# Patient Record
Sex: Female | Born: 1935
Health system: Southern US, Community
[De-identification: ages and names within clinical notes are randomized; demographics above are authoritative.]

## PROBLEM LIST (undated history)

## (undated) DIAGNOSIS — G459 Transient cerebral ischemic attack, unspecified: Secondary | ICD-10-CM

## (undated) DIAGNOSIS — I4891 Unspecified atrial fibrillation: Secondary | ICD-10-CM

## (undated) HISTORY — PX: WRIST FRACTURE SURGERY: SHX121

## (undated) HISTORY — PX: FEMUR FRACTURE SURGERY: SHX633

---

## 2011-09-12 DIAGNOSIS — H2589 Other age-related cataract: Secondary | ICD-10-CM | POA: Diagnosis not present

## 2011-09-19 DIAGNOSIS — H269 Unspecified cataract: Secondary | ICD-10-CM | POA: Diagnosis not present

## 2011-09-19 DIAGNOSIS — H2589 Other age-related cataract: Secondary | ICD-10-CM | POA: Diagnosis not present

## 2011-10-10 DIAGNOSIS — H269 Unspecified cataract: Secondary | ICD-10-CM | POA: Diagnosis not present

## 2011-10-10 DIAGNOSIS — H2589 Other age-related cataract: Secondary | ICD-10-CM | POA: Diagnosis not present

## 2013-03-13 DIAGNOSIS — G459 Transient cerebral ischemic attack, unspecified: Secondary | ICD-10-CM

## 2013-03-13 HISTORY — DX: Transient cerebral ischemic attack, unspecified: G45.9

## 2013-10-30 DIAGNOSIS — F17200 Nicotine dependence, unspecified, uncomplicated: Secondary | ICD-10-CM | POA: Diagnosis not present

## 2013-10-30 DIAGNOSIS — I509 Heart failure, unspecified: Secondary | ICD-10-CM | POA: Diagnosis not present

## 2013-10-30 DIAGNOSIS — F411 Generalized anxiety disorder: Secondary | ICD-10-CM | POA: Diagnosis not present

## 2013-10-30 DIAGNOSIS — J9600 Acute respiratory failure, unspecified whether with hypoxia or hypercapnia: Secondary | ICD-10-CM | POA: Diagnosis not present

## 2013-10-30 DIAGNOSIS — R4182 Altered mental status, unspecified: Secondary | ICD-10-CM | POA: Diagnosis not present

## 2013-10-30 DIAGNOSIS — G459 Transient cerebral ischemic attack, unspecified: Secondary | ICD-10-CM | POA: Diagnosis not present

## 2013-10-30 DIAGNOSIS — I658 Occlusion and stenosis of other precerebral arteries: Secondary | ICD-10-CM | POA: Diagnosis not present

## 2013-10-30 DIAGNOSIS — I359 Nonrheumatic aortic valve disorder, unspecified: Secondary | ICD-10-CM | POA: Diagnosis not present

## 2013-10-30 DIAGNOSIS — I059 Rheumatic mitral valve disease, unspecified: Secondary | ICD-10-CM | POA: Diagnosis not present

## 2013-10-30 DIAGNOSIS — M79609 Pain in unspecified limb: Secondary | ICD-10-CM | POA: Diagnosis not present

## 2013-10-30 DIAGNOSIS — S0990XA Unspecified injury of head, initial encounter: Secondary | ICD-10-CM | POA: Diagnosis not present

## 2013-10-30 DIAGNOSIS — I4891 Unspecified atrial fibrillation: Secondary | ICD-10-CM | POA: Diagnosis not present

## 2013-10-30 DIAGNOSIS — R5383 Other fatigue: Secondary | ICD-10-CM | POA: Diagnosis not present

## 2013-10-30 DIAGNOSIS — R0902 Hypoxemia: Secondary | ICD-10-CM | POA: Diagnosis not present

## 2013-10-30 DIAGNOSIS — R29810 Facial weakness: Secondary | ICD-10-CM | POA: Diagnosis present

## 2013-10-30 DIAGNOSIS — R404 Transient alteration of awareness: Secondary | ICD-10-CM | POA: Diagnosis not present

## 2013-10-30 DIAGNOSIS — I6350 Cerebral infarction due to unspecified occlusion or stenosis of unspecified cerebral artery: Secondary | ICD-10-CM | POA: Diagnosis not present

## 2013-10-30 DIAGNOSIS — I517 Cardiomegaly: Secondary | ICD-10-CM | POA: Diagnosis not present

## 2013-10-30 DIAGNOSIS — R4789 Other speech disturbances: Secondary | ICD-10-CM | POA: Diagnosis present

## 2013-10-30 DIAGNOSIS — I369 Nonrheumatic tricuspid valve disorder, unspecified: Secondary | ICD-10-CM | POA: Diagnosis not present

## 2013-10-30 DIAGNOSIS — R5381 Other malaise: Secondary | ICD-10-CM | POA: Diagnosis present

## 2013-11-06 DIAGNOSIS — IMO0002 Reserved for concepts with insufficient information to code with codable children: Secondary | ICD-10-CM | POA: Diagnosis not present

## 2013-11-06 DIAGNOSIS — Z1331 Encounter for screening for depression: Secondary | ICD-10-CM | POA: Diagnosis not present

## 2013-11-06 DIAGNOSIS — I6789 Other cerebrovascular disease: Secondary | ICD-10-CM | POA: Diagnosis not present

## 2013-11-06 DIAGNOSIS — I509 Heart failure, unspecified: Secondary | ICD-10-CM | POA: Diagnosis not present

## 2013-11-27 DIAGNOSIS — Z79899 Other long term (current) drug therapy: Secondary | ICD-10-CM | POA: Diagnosis not present

## 2013-11-27 DIAGNOSIS — I4891 Unspecified atrial fibrillation: Secondary | ICD-10-CM | POA: Diagnosis not present

## 2013-11-27 DIAGNOSIS — I509 Heart failure, unspecified: Secondary | ICD-10-CM | POA: Diagnosis not present

## 2013-11-27 DIAGNOSIS — Z6827 Body mass index (BMI) 27.0-27.9, adult: Secondary | ICD-10-CM | POA: Diagnosis not present

## 2014-01-01 DIAGNOSIS — Z6827 Body mass index (BMI) 27.0-27.9, adult: Secondary | ICD-10-CM | POA: Diagnosis not present

## 2014-01-01 DIAGNOSIS — Z1389 Encounter for screening for other disorder: Secondary | ICD-10-CM | POA: Diagnosis not present

## 2014-01-01 DIAGNOSIS — I4891 Unspecified atrial fibrillation: Secondary | ICD-10-CM | POA: Diagnosis not present

## 2014-01-01 DIAGNOSIS — I639 Cerebral infarction, unspecified: Secondary | ICD-10-CM | POA: Diagnosis not present

## 2014-01-01 DIAGNOSIS — I10 Essential (primary) hypertension: Secondary | ICD-10-CM | POA: Diagnosis not present

## 2014-01-01 DIAGNOSIS — Z9181 History of falling: Secondary | ICD-10-CM | POA: Diagnosis not present

## 2014-01-01 DIAGNOSIS — I509 Heart failure, unspecified: Secondary | ICD-10-CM | POA: Diagnosis not present

## 2014-04-09 DIAGNOSIS — I10 Essential (primary) hypertension: Secondary | ICD-10-CM | POA: Diagnosis not present

## 2014-04-09 DIAGNOSIS — K219 Gastro-esophageal reflux disease without esophagitis: Secondary | ICD-10-CM | POA: Diagnosis not present

## 2014-04-09 DIAGNOSIS — I509 Heart failure, unspecified: Secondary | ICD-10-CM | POA: Diagnosis not present

## 2014-04-09 DIAGNOSIS — I4891 Unspecified atrial fibrillation: Secondary | ICD-10-CM | POA: Diagnosis not present

## 2014-04-09 DIAGNOSIS — Z6827 Body mass index (BMI) 27.0-27.9, adult: Secondary | ICD-10-CM | POA: Diagnosis not present

## 2014-08-13 DIAGNOSIS — Z1389 Encounter for screening for other disorder: Secondary | ICD-10-CM | POA: Diagnosis not present

## 2014-08-13 DIAGNOSIS — I509 Heart failure, unspecified: Secondary | ICD-10-CM | POA: Diagnosis not present

## 2014-08-13 DIAGNOSIS — Z6827 Body mass index (BMI) 27.0-27.9, adult: Secondary | ICD-10-CM | POA: Diagnosis not present

## 2014-08-13 DIAGNOSIS — I639 Cerebral infarction, unspecified: Secondary | ICD-10-CM | POA: Diagnosis not present

## 2014-08-13 DIAGNOSIS — I4891 Unspecified atrial fibrillation: Secondary | ICD-10-CM | POA: Diagnosis not present

## 2014-08-13 DIAGNOSIS — K219 Gastro-esophageal reflux disease without esophagitis: Secondary | ICD-10-CM | POA: Diagnosis not present

## 2014-08-13 DIAGNOSIS — I10 Essential (primary) hypertension: Secondary | ICD-10-CM | POA: Diagnosis not present

## 2014-12-17 DIAGNOSIS — I482 Chronic atrial fibrillation: Secondary | ICD-10-CM | POA: Diagnosis not present

## 2014-12-17 DIAGNOSIS — I679 Cerebrovascular disease, unspecified: Secondary | ICD-10-CM | POA: Diagnosis not present

## 2014-12-17 DIAGNOSIS — M79602 Pain in left arm: Secondary | ICD-10-CM | POA: Diagnosis not present

## 2014-12-17 DIAGNOSIS — I10 Essential (primary) hypertension: Secondary | ICD-10-CM | POA: Diagnosis not present

## 2014-12-17 DIAGNOSIS — Z9181 History of falling: Secondary | ICD-10-CM | POA: Diagnosis not present

## 2014-12-17 DIAGNOSIS — Z6827 Body mass index (BMI) 27.0-27.9, adult: Secondary | ICD-10-CM | POA: Diagnosis not present

## 2014-12-17 DIAGNOSIS — K219 Gastro-esophageal reflux disease without esophagitis: Secondary | ICD-10-CM | POA: Diagnosis not present

## 2015-06-03 DIAGNOSIS — I482 Chronic atrial fibrillation: Secondary | ICD-10-CM | POA: Diagnosis not present

## 2015-06-03 DIAGNOSIS — K219 Gastro-esophageal reflux disease without esophagitis: Secondary | ICD-10-CM | POA: Diagnosis not present

## 2015-06-03 DIAGNOSIS — I509 Heart failure, unspecified: Secondary | ICD-10-CM | POA: Diagnosis not present

## 2015-06-03 DIAGNOSIS — Z6827 Body mass index (BMI) 27.0-27.9, adult: Secondary | ICD-10-CM | POA: Diagnosis not present

## 2015-06-03 DIAGNOSIS — I10 Essential (primary) hypertension: Secondary | ICD-10-CM | POA: Diagnosis not present

## 2015-06-03 DIAGNOSIS — Z720 Tobacco use: Secondary | ICD-10-CM | POA: Diagnosis not present

## 2015-10-07 DIAGNOSIS — Z1389 Encounter for screening for other disorder: Secondary | ICD-10-CM | POA: Diagnosis not present

## 2015-10-07 DIAGNOSIS — E559 Vitamin D deficiency, unspecified: Secondary | ICD-10-CM | POA: Diagnosis not present

## 2015-10-07 DIAGNOSIS — I482 Chronic atrial fibrillation: Secondary | ICD-10-CM | POA: Diagnosis not present

## 2015-10-07 DIAGNOSIS — Z9181 History of falling: Secondary | ICD-10-CM | POA: Diagnosis not present

## 2015-10-07 DIAGNOSIS — I509 Heart failure, unspecified: Secondary | ICD-10-CM | POA: Diagnosis not present

## 2015-10-07 DIAGNOSIS — Z720 Tobacco use: Secondary | ICD-10-CM | POA: Diagnosis not present

## 2015-10-07 DIAGNOSIS — I10 Essential (primary) hypertension: Secondary | ICD-10-CM | POA: Diagnosis not present

## 2016-01-01 DIAGNOSIS — S8012XA Contusion of left lower leg, initial encounter: Secondary | ICD-10-CM | POA: Diagnosis not present

## 2016-01-01 DIAGNOSIS — S70922A Unspecified superficial injury of left thigh, initial encounter: Secondary | ICD-10-CM | POA: Diagnosis not present

## 2016-01-01 DIAGNOSIS — M25532 Pain in left wrist: Secondary | ICD-10-CM | POA: Diagnosis not present

## 2016-01-01 DIAGNOSIS — R9082 White matter disease, unspecified: Secondary | ICD-10-CM | POA: Diagnosis not present

## 2016-01-01 DIAGNOSIS — S199XXA Unspecified injury of neck, initial encounter: Secondary | ICD-10-CM | POA: Diagnosis not present

## 2016-01-01 DIAGNOSIS — S52592A Other fractures of lower end of left radius, initial encounter for closed fracture: Secondary | ICD-10-CM | POA: Diagnosis not present

## 2016-01-01 DIAGNOSIS — M25552 Pain in left hip: Secondary | ICD-10-CM | POA: Diagnosis not present

## 2016-01-01 DIAGNOSIS — S79912A Unspecified injury of left hip, initial encounter: Secondary | ICD-10-CM | POA: Diagnosis not present

## 2016-01-01 DIAGNOSIS — S52532A Colles' fracture of left radius, initial encounter for closed fracture: Secondary | ICD-10-CM | POA: Diagnosis not present

## 2016-01-01 DIAGNOSIS — S0990XA Unspecified injury of head, initial encounter: Secondary | ICD-10-CM | POA: Diagnosis not present

## 2016-01-01 DIAGNOSIS — S8992XA Unspecified injury of left lower leg, initial encounter: Secondary | ICD-10-CM | POA: Diagnosis not present

## 2016-01-01 DIAGNOSIS — M25562 Pain in left knee: Secondary | ICD-10-CM | POA: Diagnosis not present

## 2016-01-01 DIAGNOSIS — T148XXA Other injury of unspecified body region, initial encounter: Secondary | ICD-10-CM | POA: Diagnosis not present

## 2016-01-01 DIAGNOSIS — M79652 Pain in left thigh: Secondary | ICD-10-CM | POA: Diagnosis not present

## 2016-01-01 DIAGNOSIS — I700 Atherosclerosis of aorta: Secondary | ICD-10-CM | POA: Diagnosis not present

## 2016-01-01 DIAGNOSIS — M79662 Pain in left lower leg: Secondary | ICD-10-CM | POA: Diagnosis not present

## 2016-01-04 DIAGNOSIS — S52602A Unspecified fracture of lower end of left ulna, initial encounter for closed fracture: Secondary | ICD-10-CM | POA: Diagnosis not present

## 2016-01-04 DIAGNOSIS — S52502A Unspecified fracture of the lower end of left radius, initial encounter for closed fracture: Secondary | ICD-10-CM | POA: Diagnosis not present

## 2016-02-01 DIAGNOSIS — S52502A Unspecified fracture of the lower end of left radius, initial encounter for closed fracture: Secondary | ICD-10-CM | POA: Diagnosis not present

## 2016-02-10 DIAGNOSIS — I10 Essential (primary) hypertension: Secondary | ICD-10-CM | POA: Diagnosis not present

## 2016-02-10 DIAGNOSIS — S62102D Fracture of unspecified carpal bone, left wrist, subsequent encounter for fracture with routine healing: Secondary | ICD-10-CM | POA: Diagnosis not present

## 2016-02-10 DIAGNOSIS — Z2821 Immunization not carried out because of patient refusal: Secondary | ICD-10-CM | POA: Diagnosis not present

## 2016-02-10 DIAGNOSIS — I509 Heart failure, unspecified: Secondary | ICD-10-CM | POA: Diagnosis not present

## 2016-02-10 DIAGNOSIS — Z720 Tobacco use: Secondary | ICD-10-CM | POA: Diagnosis not present

## 2016-02-10 DIAGNOSIS — I482 Chronic atrial fibrillation: Secondary | ICD-10-CM | POA: Diagnosis not present

## 2016-02-17 DIAGNOSIS — S52502A Unspecified fracture of the lower end of left radius, initial encounter for closed fracture: Secondary | ICD-10-CM | POA: Diagnosis not present

## 2016-03-24 DIAGNOSIS — S52502A Unspecified fracture of the lower end of left radius, initial encounter for closed fracture: Secondary | ICD-10-CM | POA: Diagnosis not present

## 2016-06-08 DIAGNOSIS — K219 Gastro-esophageal reflux disease without esophagitis: Secondary | ICD-10-CM | POA: Diagnosis not present

## 2016-06-08 DIAGNOSIS — I509 Heart failure, unspecified: Secondary | ICD-10-CM | POA: Diagnosis not present

## 2016-06-08 DIAGNOSIS — I472 Ventricular tachycardia: Secondary | ICD-10-CM | POA: Diagnosis not present

## 2016-06-08 DIAGNOSIS — I10 Essential (primary) hypertension: Secondary | ICD-10-CM | POA: Diagnosis not present

## 2016-06-08 DIAGNOSIS — Z6827 Body mass index (BMI) 27.0-27.9, adult: Secondary | ICD-10-CM | POA: Diagnosis not present

## 2016-10-12 DIAGNOSIS — I509 Heart failure, unspecified: Secondary | ICD-10-CM | POA: Diagnosis not present

## 2016-10-12 DIAGNOSIS — Z6827 Body mass index (BMI) 27.0-27.9, adult: Secondary | ICD-10-CM | POA: Diagnosis not present

## 2016-10-12 DIAGNOSIS — Z9181 History of falling: Secondary | ICD-10-CM | POA: Diagnosis not present

## 2016-10-12 DIAGNOSIS — I679 Cerebrovascular disease, unspecified: Secondary | ICD-10-CM | POA: Diagnosis not present

## 2016-10-12 DIAGNOSIS — I482 Chronic atrial fibrillation: Secondary | ICD-10-CM | POA: Diagnosis not present

## 2016-10-12 DIAGNOSIS — R42 Dizziness and giddiness: Secondary | ICD-10-CM | POA: Diagnosis not present

## 2016-10-12 DIAGNOSIS — I10 Essential (primary) hypertension: Secondary | ICD-10-CM | POA: Diagnosis not present

## 2016-10-12 DIAGNOSIS — Z1389 Encounter for screening for other disorder: Secondary | ICD-10-CM | POA: Diagnosis not present

## 2017-01-16 DIAGNOSIS — R29705 NIHSS score 5: Secondary | ICD-10-CM | POA: Diagnosis not present

## 2017-01-16 DIAGNOSIS — R531 Weakness: Secondary | ICD-10-CM | POA: Diagnosis not present

## 2017-01-16 DIAGNOSIS — I4891 Unspecified atrial fibrillation: Secondary | ICD-10-CM | POA: Diagnosis not present

## 2017-01-16 DIAGNOSIS — I639 Cerebral infarction, unspecified: Secondary | ICD-10-CM | POA: Diagnosis not present

## 2017-01-16 DIAGNOSIS — K219 Gastro-esophageal reflux disease without esophagitis: Secondary | ICD-10-CM | POA: Diagnosis not present

## 2017-01-16 DIAGNOSIS — I6789 Other cerebrovascular disease: Secondary | ICD-10-CM | POA: Diagnosis not present

## 2017-01-16 DIAGNOSIS — F17210 Nicotine dependence, cigarettes, uncomplicated: Secondary | ICD-10-CM | POA: Diagnosis not present

## 2017-01-17 ENCOUNTER — Emergency Department (HOSPITAL_COMMUNITY): Payer: Medicare Other

## 2017-01-17 ENCOUNTER — Inpatient Hospital Stay (HOSPITAL_COMMUNITY): Payer: Medicare Other

## 2017-01-17 ENCOUNTER — Encounter (HOSPITAL_COMMUNITY): Payer: Self-pay | Admitting: Emergency Medicine

## 2017-01-17 ENCOUNTER — Inpatient Hospital Stay (HOSPITAL_COMMUNITY)
Admission: EM | Admit: 2017-01-17 | Discharge: 2017-01-20 | DRG: 064 | Disposition: A | Discharge status: Home Health | Payer: Medicare Other | Attending: Internal Medicine | Admitting: Internal Medicine

## 2017-01-17 DIAGNOSIS — I63512 Cerebral infarction due to unspecified occlusion or stenosis of left middle cerebral artery: Secondary | ICD-10-CM

## 2017-01-17 DIAGNOSIS — K219 Gastro-esophageal reflux disease without esophagitis: Secondary | ICD-10-CM | POA: Diagnosis present

## 2017-01-17 DIAGNOSIS — Z66 Do not resuscitate: Secondary | ICD-10-CM | POA: Diagnosis present

## 2017-01-17 DIAGNOSIS — G8191 Hemiplegia, unspecified affecting right dominant side: Secondary | ICD-10-CM | POA: Diagnosis present

## 2017-01-17 DIAGNOSIS — R4702 Dysphasia: Secondary | ICD-10-CM | POA: Diagnosis present

## 2017-01-17 DIAGNOSIS — R29707 NIHSS score 7: Secondary | ICD-10-CM | POA: Diagnosis present

## 2017-01-17 DIAGNOSIS — Z9282 Status post administration of tPA (rtPA) in a different facility within the last 24 hours prior to admission to current facility: Secondary | ICD-10-CM | POA: Diagnosis not present

## 2017-01-17 DIAGNOSIS — I609 Nontraumatic subarachnoid hemorrhage, unspecified: Secondary | ICD-10-CM | POA: Diagnosis not present

## 2017-01-17 DIAGNOSIS — I63412 Cerebral infarction due to embolism of left middle cerebral artery: Principal | ICD-10-CM | POA: Diagnosis present

## 2017-01-17 DIAGNOSIS — I482 Chronic atrial fibrillation, unspecified: Secondary | ICD-10-CM

## 2017-01-17 DIAGNOSIS — Z8673 Personal history of transient ischemic attack (TIA), and cerebral infarction without residual deficits: Secondary | ICD-10-CM | POA: Diagnosis not present

## 2017-01-17 DIAGNOSIS — R29705 NIHSS score 5: Secondary | ICD-10-CM | POA: Diagnosis not present

## 2017-01-17 DIAGNOSIS — S065X9A Traumatic subdural hemorrhage with loss of consciousness of unspecified duration, initial encounter: Secondary | ICD-10-CM | POA: Diagnosis not present

## 2017-01-17 DIAGNOSIS — T45615A Adverse effect of thrombolytic drugs, initial encounter: Secondary | ICD-10-CM | POA: Diagnosis present

## 2017-01-17 DIAGNOSIS — R471 Dysarthria and anarthria: Secondary | ICD-10-CM | POA: Diagnosis present

## 2017-01-17 DIAGNOSIS — R1310 Dysphagia, unspecified: Secondary | ICD-10-CM | POA: Diagnosis present

## 2017-01-17 DIAGNOSIS — K921 Melena: Secondary | ICD-10-CM | POA: Diagnosis present

## 2017-01-17 DIAGNOSIS — R739 Hyperglycemia, unspecified: Secondary | ICD-10-CM | POA: Diagnosis present

## 2017-01-17 DIAGNOSIS — J9601 Acute respiratory failure with hypoxia: Secondary | ICD-10-CM | POA: Diagnosis present

## 2017-01-17 DIAGNOSIS — E873 Alkalosis: Secondary | ICD-10-CM | POA: Diagnosis present

## 2017-01-17 DIAGNOSIS — J810 Acute pulmonary edema: Secondary | ICD-10-CM

## 2017-01-17 DIAGNOSIS — K625 Hemorrhage of anus and rectum: Secondary | ICD-10-CM | POA: Diagnosis not present

## 2017-01-17 DIAGNOSIS — S065XAA Traumatic subdural hemorrhage with loss of consciousness status unknown, initial encounter: Secondary | ICD-10-CM

## 2017-01-17 DIAGNOSIS — I5022 Chronic systolic (congestive) heart failure: Secondary | ICD-10-CM | POA: Diagnosis present

## 2017-01-17 DIAGNOSIS — D649 Anemia, unspecified: Secondary | ICD-10-CM | POA: Diagnosis not present

## 2017-01-17 DIAGNOSIS — I429 Cardiomyopathy, unspecified: Secondary | ICD-10-CM | POA: Diagnosis present

## 2017-01-17 DIAGNOSIS — Z9114 Patient's other noncompliance with medication regimen: Secondary | ICD-10-CM

## 2017-01-17 DIAGNOSIS — I6200 Nontraumatic subdural hemorrhage, unspecified: Secondary | ICD-10-CM | POA: Diagnosis present

## 2017-01-17 DIAGNOSIS — I4891 Unspecified atrial fibrillation: Secondary | ICD-10-CM | POA: Diagnosis not present

## 2017-01-17 DIAGNOSIS — I517 Cardiomegaly: Secondary | ICD-10-CM | POA: Diagnosis not present

## 2017-01-17 DIAGNOSIS — I6789 Other cerebrovascular disease: Secondary | ICD-10-CM | POA: Diagnosis not present

## 2017-01-17 DIAGNOSIS — I255 Ischemic cardiomyopathy: Secondary | ICD-10-CM

## 2017-01-17 DIAGNOSIS — I361 Nonrheumatic tricuspid (valve) insufficiency: Secondary | ICD-10-CM

## 2017-01-17 DIAGNOSIS — I110 Hypertensive heart disease with heart failure: Secondary | ICD-10-CM | POA: Diagnosis present

## 2017-01-17 DIAGNOSIS — R4182 Altered mental status, unspecified: Secondary | ICD-10-CM | POA: Diagnosis not present

## 2017-01-17 DIAGNOSIS — F17210 Nicotine dependence, cigarettes, uncomplicated: Secondary | ICD-10-CM | POA: Diagnosis present

## 2017-01-17 DIAGNOSIS — F0390 Unspecified dementia without behavioral disturbance: Secondary | ICD-10-CM | POA: Diagnosis present

## 2017-01-17 DIAGNOSIS — G9341 Metabolic encephalopathy: Secondary | ICD-10-CM | POA: Diagnosis present

## 2017-01-17 DIAGNOSIS — I10 Essential (primary) hypertension: Secondary | ICD-10-CM | POA: Diagnosis not present

## 2017-01-17 DIAGNOSIS — J9621 Acute and chronic respiratory failure with hypoxia: Secondary | ICD-10-CM | POA: Diagnosis not present

## 2017-01-17 DIAGNOSIS — R0602 Shortness of breath: Secondary | ICD-10-CM | POA: Diagnosis not present

## 2017-01-17 DIAGNOSIS — R531 Weakness: Secondary | ICD-10-CM | POA: Diagnosis not present

## 2017-01-17 DIAGNOSIS — I509 Heart failure, unspecified: Secondary | ICD-10-CM

## 2017-01-17 DIAGNOSIS — Z7982 Long term (current) use of aspirin: Secondary | ICD-10-CM | POA: Diagnosis not present

## 2017-01-17 DIAGNOSIS — S066X0A Traumatic subarachnoid hemorrhage without loss of consciousness, initial encounter: Secondary | ICD-10-CM | POA: Diagnosis not present

## 2017-01-17 DIAGNOSIS — I639 Cerebral infarction, unspecified: Secondary | ICD-10-CM | POA: Diagnosis not present

## 2017-01-17 DIAGNOSIS — D72829 Elevated white blood cell count, unspecified: Secondary | ICD-10-CM | POA: Diagnosis present

## 2017-01-17 DIAGNOSIS — E785 Hyperlipidemia, unspecified: Secondary | ICD-10-CM | POA: Diagnosis present

## 2017-01-17 DIAGNOSIS — R4701 Aphasia: Secondary | ICD-10-CM | POA: Diagnosis present

## 2017-01-17 DIAGNOSIS — Z4682 Encounter for fitting and adjustment of non-vascular catheter: Secondary | ICD-10-CM | POA: Diagnosis not present

## 2017-01-17 DIAGNOSIS — S065X0A Traumatic subdural hemorrhage without loss of consciousness, initial encounter: Secondary | ICD-10-CM | POA: Diagnosis not present

## 2017-01-17 HISTORY — DX: Transient cerebral ischemic attack, unspecified: G45.9

## 2017-01-17 HISTORY — DX: Unspecified atrial fibrillation: I48.91

## 2017-01-17 LAB — CBC WITH DIFFERENTIAL/PLATELET
BASOS PCT: 1 %
Basophils Absolute: 0.1 10*3/uL (ref 0.0–0.1)
EOS ABS: 0.3 10*3/uL (ref 0.0–0.7)
Eosinophils Relative: 4 %
HCT: 39.1 % (ref 36.0–46.0)
Hemoglobin: 12.1 g/dL (ref 12.0–15.0)
Lymphocytes Relative: 17 %
Lymphs Abs: 1.5 10*3/uL (ref 0.7–4.0)
MCH: 28.4 pg (ref 26.0–34.0)
MCHC: 30.9 g/dL (ref 30.0–36.0)
MCV: 91.8 fL (ref 78.0–100.0)
MONOS PCT: 6 %
Monocytes Absolute: 0.5 10*3/uL (ref 0.1–1.0)
Neutro Abs: 6.3 10*3/uL (ref 1.7–7.7)
Neutrophils Relative %: 72 %
Platelets: 221 10*3/uL (ref 150–400)
RBC: 4.26 MIL/uL (ref 3.87–5.11)
RDW: 13.3 % (ref 11.5–15.5)
WBC: 8.6 10*3/uL (ref 4.0–10.5)

## 2017-01-17 LAB — URINALYSIS, ROUTINE W REFLEX MICROSCOPIC
Bilirubin Urine: NEGATIVE
GLUCOSE, UA: NEGATIVE mg/dL
Hgb urine dipstick: NEGATIVE
Ketones, ur: NEGATIVE mg/dL
LEUKOCYTES UA: NEGATIVE
NITRITE: NEGATIVE
PH: 5 (ref 5.0–8.0)
Protein, ur: NEGATIVE mg/dL

## 2017-01-17 LAB — I-STAT CHEM 8, ED
BUN: 15 mg/dL (ref 6–20)
CALCIUM ION: 1.16 mmol/L (ref 1.15–1.40)
CHLORIDE: 106 mmol/L (ref 101–111)
CREATININE: 0.8 mg/dL (ref 0.44–1.00)
GLUCOSE: 182 mg/dL — AB (ref 65–99)
HCT: 36 % (ref 36.0–46.0)
Hemoglobin: 12.2 g/dL (ref 12.0–15.0)
Potassium: 4.1 mmol/L (ref 3.5–5.1)
Sodium: 140 mmol/L (ref 135–145)
TCO2: 25 mmol/L (ref 22–32)

## 2017-01-17 LAB — BRAIN NATRIURETIC PEPTIDE: B NATRIURETIC PEPTIDE 5: 140.1 pg/mL — AB (ref 0.0–100.0)

## 2017-01-17 LAB — COMPREHENSIVE METABOLIC PANEL
ALBUMIN: 3.1 g/dL — AB (ref 3.5–5.0)
ALT: 16 U/L (ref 14–54)
ANION GAP: 9 (ref 5–15)
AST: 30 U/L (ref 15–41)
Alkaline Phosphatase: 91 U/L (ref 38–126)
BILIRUBIN TOTAL: 0.7 mg/dL (ref 0.3–1.2)
BUN: 13 mg/dL (ref 6–20)
CO2: 23 mmol/L (ref 22–32)
Calcium: 8.9 mg/dL (ref 8.9–10.3)
Chloride: 106 mmol/L (ref 101–111)
Creatinine, Ser: 0.88 mg/dL (ref 0.44–1.00)
GFR calc Af Amer: >60 mL/min (ref >60)
GFR calc non Af Amer: 59 mL/min — ABNORMAL LOW (ref >60)
GLUCOSE: 183 mg/dL — AB (ref 65–99)
POTASSIUM: 4.1 mmol/L (ref 3.5–5.1)
Sodium: 138 mmol/L (ref 135–145)
TOTAL PROTEIN: 6.2 g/dL — AB (ref 6.5–8.1)

## 2017-01-17 LAB — I-STAT TROPONIN, ED: Troponin i, poc: 0 ng/mL (ref 0.00–0.08)

## 2017-01-17 LAB — MAGNESIUM: MAGNESIUM: 1.7 mg/dL (ref 1.7–2.4)

## 2017-01-17 LAB — CBG MONITORING, ED: Glucose-Capillary: 179 mg/dL — ABNORMAL HIGH (ref 65–99)

## 2017-01-17 LAB — I-STAT ARTERIAL BLOOD GAS, ED
Acid-Base Excess: 2 mmol/L (ref 0.0–2.0)
BICARBONATE: 28.9 mmol/L — AB (ref 20.0–28.0)
O2 SAT: 100 %
PCO2 ART: 54.3 mmHg — AB (ref 32.0–48.0)
PO2 ART: 444 mmHg — AB (ref 83.0–108.0)
Patient temperature: 97.9
TCO2: 31 mmol/L (ref 22–32)
pH, Arterial: 7.332 — ABNORMAL LOW (ref 7.350–7.450)

## 2017-01-17 LAB — TYPE AND SCREEN
ABO/RH(D): A POS
Antibody Screen: NEGATIVE

## 2017-01-17 LAB — GLUCOSE, CAPILLARY
GLUCOSE-CAPILLARY: 144 mg/dL — AB (ref 65–99)
Glucose-Capillary: 113 mg/dL — ABNORMAL HIGH (ref 65–99)
Glucose-Capillary: 113 mg/dL — ABNORMAL HIGH (ref 65–99)
Glucose-Capillary: 116 mg/dL — ABNORMAL HIGH (ref 65–99)
Glucose-Capillary: 148 mg/dL — ABNORMAL HIGH (ref 65–99)

## 2017-01-17 LAB — ECHOCARDIOGRAM COMPLETE
Height: 63 in
Weight: 2511.48 oz

## 2017-01-17 LAB — PROTIME-INR
INR: 1.08
INR: 1.03
Prothrombin Time: 13.9 seconds (ref 11.4–15.2)
Prothrombin Time: 13.4 seconds (ref 11.4–15.2)

## 2017-01-17 LAB — MRSA PCR SCREENING: MRSA by PCR: NEGATIVE

## 2017-01-17 LAB — ABO/RH: ABO/RH(D): A POS

## 2017-01-17 LAB — FIBRINOGEN: FIBRINOGEN: 357 mg/dL (ref 210–475)

## 2017-01-17 LAB — PHOSPHORUS: PHOSPHORUS: 4.8 mg/dL — AB (ref 2.5–4.6)

## 2017-01-17 LAB — TRIGLYCERIDES: Triglycerides: 115 mg/dL (ref <150)

## 2017-01-17 LAB — APTT: APTT: 27 s (ref 24–36)

## 2017-01-17 MED ORDER — METHYLPREDNISOLONE SODIUM SUCC 125 MG IJ SOLR
125.0000 mg | Freq: Once | INTRAMUSCULAR | Status: AC
  Administered 2017-01-17: 125 mg via INTRAVENOUS
  Filled 2017-01-17: qty 2

## 2017-01-17 MED ORDER — ALBUTEROL (5 MG/ML) CONTINUOUS INHALATION SOLN: 10.0000 mg/h | INHALATION_SOLUTION | RESPIRATORY_TRACT | Status: DC

## 2017-01-17 MED ORDER — TRANEXAMIC ACID 1000 MG/10ML IV SOLN
700.0000 mg | INTRAVENOUS | Status: AC
  Administered 2017-01-17: 700 mg via INTRAVENOUS
  Filled 2017-01-17: qty 7

## 2017-01-17 MED ORDER — MIDAZOLAM HCL 2 MG/2ML IJ SOLN: 1.0000 mg | INTRAMUSCULAR | Status: DC | PRN

## 2017-01-17 MED ORDER — ALBUTEROL SULFATE (2.5 MG/3ML) 0.083% IN NEBU: 2.5000 mg | INHALATION_SOLUTION | RESPIRATORY_TRACT | Status: DC | PRN

## 2017-01-17 MED ORDER — CHLORHEXIDINE GLUCONATE 0.12% ORAL RINSE (MEDLINE KIT)
15.0000 mL | Freq: Two times a day (BID) | OROMUCOSAL | Status: DC
  Administered 2017-01-17 – 2017-01-18 (×3): 15 mL via OROMUCOSAL

## 2017-01-17 MED ORDER — PROPOFOL 1000 MG/100ML IV EMUL
0.0000 ug/kg/min | INTRAVENOUS | Status: DC
  Administered 2017-01-17: 50 ug/kg/min via INTRAVENOUS
  Administered 2017-01-17: 25 ug/kg/min via INTRAVENOUS
  Administered 2017-01-17: 15 ug/kg/min via INTRAVENOUS
  Filled 2017-01-17 (×3): qty 100

## 2017-01-17 MED ORDER — ORAL CARE MOUTH RINSE
15.0000 mL | OROMUCOSAL | Status: DC
  Administered 2017-01-17 – 2017-01-18 (×12): 15 mL via OROMUCOSAL

## 2017-01-17 MED ORDER — PROPOFOL 1000 MG/100ML IV EMUL
INTRAVENOUS | Status: AC
  Administered 2017-01-17: 03:00:00
  Filled 2017-01-17: qty 100

## 2017-01-17 MED ORDER — IPRATROPIUM BROMIDE 0.02 % IN SOLN
1.0000 mg | RESPIRATORY_TRACT | Status: AC
  Filled 2017-01-17: qty 5

## 2017-01-17 MED ORDER — FUROSEMIDE 10 MG/ML IJ SOLN
40.0000 mg | Freq: Once | INTRAMUSCULAR | Status: AC
  Administered 2017-01-17: 40 mg via INTRAVENOUS
  Filled 2017-01-17: qty 4

## 2017-01-17 MED ORDER — ALBUTEROL (5 MG/ML) CONTINUOUS INHALATION SOLN
INHALATION_SOLUTION | RESPIRATORY_TRACT | Status: AC
  Administered 2017-01-17: 10 mg
  Filled 2017-01-17: qty 2

## 2017-01-17 MED ORDER — ETOMIDATE 2 MG/ML IV SOLN
INTRAVENOUS | Status: AC | PRN
  Administered 2017-01-17: 20 mg via INTRAVENOUS

## 2017-01-17 MED ORDER — INSULIN ASPART 100 UNIT/ML ~~LOC~~ SOLN
0.0000 [IU] | Freq: Three times a day (TID) | SUBCUTANEOUS | Status: DC
  Administered 2017-01-17: 1 [IU] via SUBCUTANEOUS

## 2017-01-17 MED ORDER — IPRATROPIUM BROMIDE 0.02 % IN SOLN
RESPIRATORY_TRACT | Status: AC
  Administered 2017-01-17: 1 mg
  Filled 2017-01-17: qty 5

## 2017-01-17 MED ORDER — PANTOPRAZOLE SODIUM 40 MG IV SOLR
40.0000 mg | Freq: Every day | INTRAVENOUS | Status: DC
  Administered 2017-01-17 – 2017-01-18 (×2): 40 mg via INTRAVENOUS
  Filled 2017-01-17 (×2): qty 40

## 2017-01-17 MED ORDER — LABETALOL HCL 5 MG/ML IV SOLN
20.0000 mg | Freq: Once | INTRAVENOUS | Status: DC
  Filled 2017-01-17: qty 4

## 2017-01-17 MED ORDER — SODIUM CHLORIDE 0.9 % IV SOLN
INTRAVENOUS | Status: DC
  Administered 2017-01-17 – 2017-01-19 (×4): via INTRAVENOUS

## 2017-01-17 MED ORDER — LORAZEPAM 2 MG/ML IJ SOLN
INTRAMUSCULAR | Status: AC
  Administered 2017-01-17: 2 mg
  Filled 2017-01-17: qty 1

## 2017-01-17 MED ORDER — SUCCINYLCHOLINE CHLORIDE 20 MG/ML IJ SOLN
INTRAMUSCULAR | Status: AC | PRN
  Administered 2017-01-17: 100 mg via INTRAVENOUS

## 2017-01-17 NOTE — ED Provider Notes (Addendum)
TIME SEEN: 2:45 AM  CHIEF COMPLAINT: Code stroke  HPI: Patient is an 83 year old female with history of CHF, atrial fibrillation not on anticoagulation , hypertension who presents to the emergency department as a transfer from [facility] as a code stroke.  Per [facility] physician patient was last seen normal at 9 PM.  She presented to the ER with expressive aphasia and right-sided weakness.  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  Telemetry neurology recommended TPA which she received at [facility] and finished at 1:56 AM.  Prior to transfer patient was stable per the ER physician Dr. Phylis BougieVillard.  Per EMS, patient was given 20 mg of labetalol prior to discharge for hypotension.  They report that she was talking to them and moving all 4 extremities in route.  They state that she was hemodynamically stable with normal oxygen saturation.  On arrival, patient is tripoding, appears agitated, complains of shortness of breath.  When we get her to the room her oxygen saturation is 74% on room air.  She is unable to answer questions appropriately.  Blood glucose was 179.  Blood pressure was 187/127.  Her daughter who is at bedside reports that she would be okay with intubation, admission.  She however feels that the patient would be a DNR if there was cardiac arrest.  ROS: Level 5 caveat for altered mental status  PAST MEDICAL HISTORY/PAST SURGICAL HISTORY:  No past medical history on file.  MEDICATIONS:  Prior to Admission medications   Not on File    ALLERGIES:  Allergies not on file  SOCIAL HISTORY:  Social History   Tobacco Use  . Smoking status: Not on file  Substance Use Topics  . Alcohol use: Not on file    FAMILY HISTORY: No family history on file.  EXAM: BP 140/83   Pulse 87   Temp 97.6 F (36.4 C) (Temporal)   Resp 18    Ht 5\' 5"  (1.651 m)   Wt 70.3 kg (155 lb)   SpO2 100%   BMI 25.79 kg/m  CONSTITUTIONAL: Patient elderly, chronically ill-appearing, afebrile, very agitated, combative, pulling off the cardiac leads in taking the nonrebreather off of her face HEAD: Normocephalic EYES: Conjunctivae clear, pupils appear equal, EOMI ENT: normal nose; moist mucous membranes NECK: Supple, no meningismus, no nuchal rigidity, no LAD  CARD: Irregularly irregular and tachycardic; S1 and S2 appreciated; no murmurs, no clicks, no rubs, no gallops RESP: Patient is hypoxic on room air with sats of 74%, diffuse wheezing and bibasilar crackles, tachypneic, speaking only several words at a time, appears to be in moderate respiratory distress ABD/GI: Normal bowel sounds; non-distended; soft, non-tender, no rebound, no guarding, no peritoneal signs, no hepatosplenomegaly RECTAL: Large amount of bright red blood coming from patient's rectum with clots BACK:  The back appears normal and is non-tender to palpation, there is no CVA tenderness EXT: Normal ROM in all joints; non-tender to palpation; mild edema noted to bilateral lower extremities; normal capillary refill; no cyanosis, no calf tenderness or swelling    SKIN: Normal color for age and race; warm; no rash NEURO: Moves all extremities equally vigorously, no obvious facial asymmetry, has a difficult time answering questions or following commands, no dysarthria appreciated   MEDICAL DECISION MAKING: Patient here as a code stroke from [facility].  Received TPA prior to transfer for an occlusion of the M2  segment of the left MCA.  Patient reportedly stable prior to transfer but EMS reports she was hypertensive and received labetalol before EMS transport.  Here patient is in respiratory distress.  She is very hypertensive.  She is a smoker but has no known history of COPD or asthma.  Does not wear oxygen at home.  I am concerned for possible flash pulmonary edema.  Patient  continues to be very combative and does not improve despite IV Ativan.  I feel given her level of confusion, combativeness and her respiratory failure that we need to intubate the patient to secure her airway.  Daughter agrees with this plan.  Dr. Otelia LimesLindzen with neurology at bedside upon patient's arrival.  Given her respiratory failure no rectal bleeding he does not feel she is a candidate for a perfusion study.  ED PROGRESS: The patient intubated in the emergency department.  We did not have to give any medications for her hypertension.  This is improved when she was intubated and on propofol.  Blood pressure now 140s/80s with a heart rate in the 90s.  Her hemoglobin is 12.2.  It was 13.0 [facility].  Chest x-ray shows pulmonary edema versus inflammatory changes.  I am also concerned that this could be pulmonary hemorrhage.  She will receive Lasix.  She is receiving albuterol and Atrovent as well given she has been wheezing as well as Solu-Medrol.   Her rectal bleeding seems to have slowed but I will discuss this case with gastroenterology given patient has potential to become critically ill due to receiving TPA.    We repeated a head CT to rule out hemorrhagic conversion given her change in mental status in route.  EMS initially reported that she was moving all extremities and talking to them.  Is difficult to tell if her change in mental status is secondary to respiratory distress or not.  Repeat head CT shows a frontal subarachnoid hemorrhage and a posterior subdural hemorrhage without mass-effect.  These are new compared to prior head CT obtained at [facility].  Because of these concerns for new bleeding, I did discuss the case again with Dr. Otelia LimesLindzen with neurology as well as Fayrene FearingJames with pharmacy.  We have all agreed that patient may benefit from IV tranexamic acid.  Order placed by pharmacist.  Patient's daughter is comfortable with this plan and is aware that giving her reversal agents  could lead to reocclusion or new stroke.  She again confirms that patient is a DNR.  4:23 AM  D/w Dr. Nicholos Johnsamachandran with critical care.  They will see the patient in the emergency department for admission.  4:40 AM  D/w Dr. Bosie ClosSchooler with [facility].  They will pass info on to morning team to see patient in consult.  4:44 AM  Joneen RoachPaul Hoffman, NP with CCM at bedside.  I reviewed all nursing notes, vitals, pertinent previous records, EKGs, lab and urine results, imaging (as available).   EKG Interpretation  Date/Time:  Wednesday January 17 2017 02:40:42 EST Ventricular Rate:  95 PR Interval:    QRS Duration: 148 QT Interval:  384 QTC Calculation: 483 R Axis:   35 Text Interpretation:  Atrial fibrillation Ventricular tachycardia, unsustained IVCD, consider atypical LBBB No old tracing to compare Confirmed by Jerene Yeager, Baxter HireKristen [phone]([id number]) on 01/17/2017 2:46:20 AM         INTUBATION Performed by: Raelyn NumberWARD, Kadi Hession N  Required items: required blood products, implants, devices, and special equipment available Patient identity confirmed: provided demographic data and hospital-assigned  identification number Time out: Immediately prior to procedure a "time out" was called to verify the correct patient, procedure, equipment, support staff and site/side marked as required.  Indications: Respiratory failure, altered mental status  Intubation method: Glidescope Laryngoscopy   Preoxygenation: BVM  Sedatives: 20 mg IV etomidate Paralytic: 100 mg IV succinylcholine  Tube Size: 7.5 cuffed  Post-procedure assessment: chest rise and ETCO2 monitor Breath sounds: equal and absent over the epigastrium Tube secured with: ETT holder Chest x-ray interpreted by radiologist and me.  Chest x-ray findings: endotracheal tube in appropriate position  Patient tolerated the procedure well with no immediate complications.     CRITICAL CARE Performed by: Raelyn Number   Total critical care time: 75  minutes  Critical care time was exclusive of separately billable procedures and treating other patients.  Critical care was necessary to treat or prevent imminent or life-threatening deterioration.  Critical care was time spent personally by me on the following activities: development of treatment plan with patient and/or surrogate as well as nursing, discussions with consultants, evaluation of patient's response to treatment, examination of patient, obtaining history from patient or surrogate, ordering and performing treatments and interventions, ordering and review of laboratory studies, ordering and review of radiographic studies, pulse oximetry and re-evaluation of patient's condition.      Michah Minton, Layla Maw, DO 01/17/17 0440    Rahn Lacuesta, Layla Maw, DO 01/17/17 [phone]

## 2017-01-17 NOTE — Procedures (Signed)
Pt transported to MRI then back to 4N29 on vent, no complications.

## 2017-01-17 NOTE — Progress Notes (Signed)
PULMONARY / CRITICAL CARE MEDICINE   Name: Terri EdwardsWilma Torres MRN: [id number] DOB: 09/19/1930    ADMISSION DATE:      CHIEF COMPLAINT:  Garbled speech  HISTORY OF PRESENT ILLNESS:   This is an 83 year old with a history of chronic atrial fibrillation not on anticoagulation and a prior history of TIA who suffered from garbled speech last night.  She was seen at an outside hospital where she was felt to have an M2 occlusion and was given TPA.  On arrival here she was acutely short of breath and required intubation and mechanical ventilation she was given 1 dose of Lasix and her PO2 following that was 444.  A repeat CT scan showed both subarachnoid and subdural blood subsequently her TPA was reversed with 1 dose of tranexamic acid.  I have held her propofol for my exam this morning and she is not at all interactive.  PAST MEDICAL HISTORY :  She  has a past medical history of A-fib (HCC) and TIA (transient ischemic attack) (2015).  PAST SURGICAL HISTORY: She  has a past surgical history that includes Femur fracture surgery and Wrist fracture surgery.  Allergies  Allergen Reactions  . Macrobid [Nitrofurantoin Macrocrystal] Shortness Of Breath    No current facility-administered medications on file prior to encounter.    Current Outpatient Medications on File Prior to Encounter  Medication Sig  . aspirin 81 MG chewable tablet Chew 81 mg daily by mouth.  . carvedilol (COREG) 3.125 MG tablet Take 3.125 mg 2 (two) times daily by mouth.  . furosemide (LASIX) 20 MG tablet Take 20 mg 2 (two) times a week by mouth.   . Multiple Vitamins-Minerals (MULTIVITAMIN ADULT) CHEW Chew 2 tablets daily by mouth.  [name]. omeprazole (PRILOSEC) 20 MG capsule Take 20 mg daily by mouth.    FAMILY HISTORY:  Her has no family status information on file.    SOCIAL HISTORY: She  reports that she has been smoking cigarettes.  She has been smoking about 0.50 packs per day. she has never used smokeless tobacco. She reports  that she drinks about 0.6 oz of alcohol per week. She reports that she does not use drugs.  VITAL SIGNS: BP 114/60   Pulse 87   Temp (!) 97.3 F (36.3 C) (Axillary)   Resp 18   Ht 5\' 3"  (1.6 m)   Wt 156 lb 15.5 oz (71.2 kg)   SpO2 100%   BMI 27.81 kg/m   HEMODYNAMICS:    VENTILATOR SETTINGS: Vent Mode: PRVC FiO2 (%):  [40 %-100 %] 40 % Set Rate:  [18 bmp] 18 bmp Vt Set:  [480 mL-520 mL] 520 mL PEEP:  [5 cmH20] 5 cmH20 Plateau Pressure:  [17 cmH20-22 cmH20] 17 cmH20  INTAKE / OUTPUT: I/O last 3 completed shifts: In: 100 [IV Piggyback:100] Out: 1300 [Urine:1300]  PHYSICAL EXAMINATION: General:  Thin to cachectic elderly white female, orally intubated and in no distress Neuro:  Propofol held for my exam. No response to voice. Does not eye open to sternal rub, does move all 4's. Pupils equal, face symmetric, corneals and gag intact   Cardiovascular: no JVD, S1 and S2 irreg irreg, no m g or rub Lungs: unlabored, symmetric air movement, no wheezes, few scattered rhonchi Abdomen:  Scafoid, soft, no organomegaly, no tenderness, no masses   LABS:  BMET Recent Labs  Lab 01/17/17 0347 01/17/17 0410  NA 138 140  K 4.1 4.1  CL 106 106  CO2 23  --   BUN 13  15  CREATININE 0.88 0.80  GLUCOSE 183* 182*    Electrolytes Recent Labs  Lab 01/17/17 0347  CALCIUM 8.9  MG 1.7  PHOS 4.8*    CBC Recent Labs  Lab 01/17/17 0347 01/17/17 0410  WBC 8.6  --   HGB 12.1 12.2  HCT 39.1 36.0  PLT 221  --     Coag's Recent Labs  Lab 01/17/17 0347  APTT 27  INR 1.08    Sepsis Markers No results for input(s): LATICACIDVEN, PROCALCITON, O2SATVEN in the last 168 hours.  ABG Recent Labs  Lab 01/17/17 0350  PHART 7.332*  PCO2ART 54.3*  PO2ART 444.0*    Liver Enzymes Recent Labs  Lab 01/17/17 0347  AST 30  ALT 16  ALKPHOS 91  BILITOT 0.7  ALBUMIN 3.1*    Cardiac Enzymes No results for input(s): TROPONINI, PROBNP in the last 168 hours.  Glucose Recent  Labs  Lab 01/17/17 0355  GLUCAP 179*    Imaging Ct Head Wo Contrast  Result Date: 01/17/2017 CLINICAL DATA:  Status post tPA administration. Altered mental status. EXAM: CT HEAD WITHOUT CONTRAST TECHNIQUE: Contiguous axial images were obtained from the base of the skull through the vertex without intravenous contrast. COMPARISON:  CTA head and neck 01/17/2017. FINDINGS: Brain: There is acute subarachnoid hemorrhage overlying the right frontal convexity. Small amount of hyperdensity along the posterior falx cerebri is also noted. No intraparenchymal hematoma. There is periventricular hypoattenuation compatible with chronic microvascular disease. Ribs a small superior parafalcine meningioma measuring 8 x 6 mm. Vascular: There is mild intravascular enhancement secondary to contrast-enhanced study performed earlier. Skull: Normal visualized skull base, calvarium and extracranial soft tissues. Sinuses/Orbits: No sinus fluid levels or advanced mucosal thickening. No mastoid effusion. Normal orbits. IMPRESSION: 1. Right frontal convexity acute subarachnoid hemorrhage without mass effect or hydrocephalus. 2. Mildly thickened appearance of the posterosuperior sagittal sinus may indicate a very small component of subdural blood adjacent to the falx cerebri. Alternatively, this appearance could be due to retained intravenous contrast within the dural venous sinuses. Critical Value/emergent results were called by telephone at the time of interpretation on 01/17/2017 at 3:45 am to Dr. Rochele RaringKRISTEN WARD , who verbally acknowledged these results. Electronically Signed   By: Deatra RobinsonKevin  Herman M.D.   On: 01/17/2017 03:47   Dg Chest Portable 1 View  Result Date: 01/17/2017 CLINICAL DATA:  Short of breath EXAM: PORTABLE CHEST 1 VIEW COMPARISON:  None. FINDINGS: Endotracheal tube tip is about 4 cm superior to the carina. Esophageal tube tip is below the diaphragm. Mild cardiomegaly. Diffuse coarse interstitial opacity. No pleural  effusion. Aortic atherosclerosis. No pneumothorax. Mild asymmetric hazy opacity in the left thorax IMPRESSION: 1. Endotracheal tube tip about 4 cm superior to carina 2. Mild cardiomegaly 3. Diffuse interstitial opacity possibly chronic but superimposed acute edema or interstitial inflammation is also considered. Diffuse hazy asymmetric opacity of left thorax likely related to rotation but asymmetric edema could also produce this appearance. Electronically Signed   By: Jasmine PangKim  Fujinaga M.D.   On: 01/17/2017 03:33   Dg Abd Portable 1 View  Result Date: 01/17/2017 CLINICAL DATA:  OG tube placement EXAM: PORTABLE ABDOMEN - 1 VIEW COMPARISON:  None. FINDINGS: Esophageal tube tip and side port projects over the mid stomach. Nonobstructed gas pattern. Contrast material within the renal collecting systems and bladder. IMPRESSION: Esophageal tube tip projects over the mid stomach Electronically Signed   By: Jasmine PangKim  Fujinaga M.D.   On: 01/17/2017 03:40       ANTIBIOTICS:  none  SIGNIFICANT EVENTS: Intubated 11/7  DISCUSSION: This is a 83 year old with chronic atrial fibrillation and a history of a prior TIA who presented to an outside hospital with garbled speech.  There is study suggested an M2 occlusion and she was given T.  On arrival here she developed respiratory distress and was intubated.  She was given a single dose of Lasix.  CT scan of the head here shows some subarachnoid and possibly small subdural hematoma consequently she was given 1 dose of tranexamic acid.  ASSESSMENT / PLAN:  PULMONARY A: Intubation at this point is primarily for airway protection.  Has chronic appearing changes on her chest x-ray and a probable left pleural effusion but these would not preclude extubation as her gas exchange is actually quite good.  And later changes have been made to correct and Normocarbia in this patient with a CNS insult.  CARDIOVASCULAR A: Chronic atrial fibrillation.  He is normally on Coreg at home  we may need to reintroduce a beta-blocker for rate control.   GASTROINTESTINAL A: prophylaxis with Protonix   HEMATOLOGIC A: recheck coagulation parameters following tpa with bleed   INFECTIOUS A:  No overt active infection at present     ENDOCRINE A: single elevated glucose. SSI ordered along with A1C     NEUROLOGIC A: M2 CVA S/P tpa with subsequent SAH and perhaps small SDH. MRI pending. No anticoagulant prior to tpa. Re check coagulation parameters. My exam not showing large gross motor defects.   FAMILY  I spoke with daughter this am, will update her following MRI Greater than 32 minutes of patient care   Penny Pia, MD Critical Care Medicine [facility] Pager: [phone]  01/17/2017, 8:51 AM

## 2017-01-17 NOTE — Progress Notes (Signed)
RT NOTE:  ABG collected, resulted and reported to MD. Vent adjustment made according to ABG results.

## 2017-01-17 NOTE — Consult Note (Signed)
Referring Provider:  Dr. Leonides Schanz Primary Care Physician:  Lowella Dandy, NP Primary Gastroenterologist:  Althia Forts  Reason for Consultation:  Rectal bleeding  HPI: Terri Torres is a 83 y.o. female presented to [facility] EGD yesterday with the acute onset of dysphagia as well as right-sided weakness. She was subsequently diagnosed with a stroke and was given IV tPA. Patient subsequently developed rectal bleeding. She also was found to have intracranial hemorrhage. GI is consulted for further evaluation.  Patient was found to have respiratory failure on arrival to [facility] and she is currently intubated. Patient seen and examined at bedside. Daughter and RN at bedside. Chart reviewed. Patient has received reversal agent for tPA. According to RN,  there is no evidence of active bleeding at this time.  Discussed with the daughter. Patient with the no  previous colonoscopy. No history of colon cancer in the family.  Past Medical History:  Diagnosis Date  . A-fib ([place])   . TIA (transient ischemic attack) 2015    Past Surgical History:  Procedure Laterality Date  . FEMUR FRACTURE SURGERY    . WRIST FRACTURE SURGERY      Prior to Admission medications   Medication Sig Start Date End Date Taking? Authorizing Provider  aspirin 81 MG chewable tablet Chew 81 mg daily by mouth.   Yes [provider]  carvedilol (COREG) 3.125 MG tablet Take 3.125 mg 2 (two) times daily by mouth. 10/31/16  Yes [provider]  furosemide (LASIX) 20 MG tablet Take 20 mg 2 (two) times a week by mouth.    Yes [provider]  Multiple Vitamins-Minerals (MULTIVITAMIN ADULT) CHEW Chew 2 tablets daily by mouth.   Yes [provider]  omeprazole (PRILOSEC) 20 MG capsule Take 20 mg daily by mouth.   Yes [provider]    Scheduled Meds: . chlorhexidine gluconate (MEDLINE KIT)  15 mL Mouth Rinse BID  . ipratropium  1 mg Nebulization STAT  . mouth rinse  15 mL Mouth Rinse 10 times  per day  . pantoprazole (PROTONIX) IV  40 mg Intravenous QHS   Continuous Infusions: . albuterol    . albuterol    . propofol (DIPRIVAN) infusion 50 mcg/kg/min (01/17/17 0616)   PRN Meds:.midazolam, midazolam  Allergies as of 01/17/2017 - Review Complete 01/17/2017  Allergen Reaction Noted  . Macrobid [nitrofurantoin macrocrystal] Shortness Of Breath 01/17/2017    No family history on file.  Social History   Socioeconomic History  . Marital status: Widowed    Spouse name: Not on file  . Number of children: 5  . Years of education: Not on file  . Highest education level: Not on file  Social Needs  . Financial resource strain: Not on file  . Food insecurity - worry: Not on file  . Food insecurity - inability: Not on file  . Transportation needs - medical: Not on file  . Transportation needs - non-medical: Not on file  Occupational History  . Not on file  Tobacco Use  . Smoking status: Current Every Day Smoker    Packs/day: 0.50    Types: Cigarettes  . Smokeless tobacco: Never Used  Substance and Sexual Activity  . Alcohol use: Yes    Alcohol/week: 0.6 oz    Types: 1 Glasses of wine per week  . Drug use: No  . Sexual activity: Not Currently  Other Topics Concern  . Not on file  Social History Narrative  . Not on file    Review of  Systems: Not able to obtain as patient is intubated.  Physical Exam: Vital signs: Vitals:   01/17/17 0630 01/17/17 0730  BP: 135/75 128/70  Pulse: 87 89  Resp: 18 18  Temp: (!) 97.3 F (36.3 C)   SpO2: 100% 100%     General:   Intubated, sedated. Currently on mechanical ventilation Lungs:  Anterior exam only. Coarse breath sounds. Heart:  Irregularly irregular heart rate. Abdomen: Soft, nontender, nondistended, bowel sounds present. No peritoneal signs Rectal:  Not performed.RN performed perianal evaluation and there was no evidence of active bleeding  GI:  Lab Results: Recent Labs    01/17/17 0347 01/17/17 0410  WBC  8.6  --   HGB 12.1 12.2  HCT 39.1 36.0  PLT 221  --    BMET Recent Labs    01/17/17 0347 01/17/17 0410  NA 138 140  K 4.1 4.1  CL 106 106  CO2 23  --   GLUCOSE 183* 182*  BUN 13 15  CREATININE 0.88 0.80  CALCIUM 8.9  --    LFT Recent Labs    01/17/17 0347  PROT 6.2*  ALBUMIN 3.1*  AST 30  ALT 16  ALKPHOS 91  BILITOT 0.7   PT/INR Recent Labs    01/17/17 0347  LABPROT 13.9  INR 1.08     Studies/Results: Ct Head Wo Contrast  Result Date: 01/17/2017 CLINICAL DATA:  Status post tPA administration. Altered mental status. EXAM: CT HEAD WITHOUT CONTRAST TECHNIQUE: Contiguous axial images were obtained from the base of the skull through the vertex without intravenous contrast. COMPARISON:  CTA head and neck 01/17/2017. FINDINGS: Brain: There is acute subarachnoid hemorrhage overlying the right frontal convexity. Small amount of hyperdensity along the posterior falx cerebri is also noted. No intraparenchymal hematoma. There is periventricular hypoattenuation compatible with chronic microvascular disease. Ribs a small superior parafalcine meningioma measuring 8 x 6 mm. Vascular: There is mild intravascular enhancement secondary to contrast-enhanced study performed earlier. Skull: Normal visualized skull base, calvarium and extracranial soft tissues. Sinuses/Orbits: No sinus fluid levels or advanced mucosal thickening. No mastoid effusion. Normal orbits. IMPRESSION: 1. Right frontal convexity acute subarachnoid hemorrhage without mass effect or hydrocephalus. 2. Mildly thickened appearance of the posterosuperior sagittal sinus may indicate a very small component of subdural blood adjacent to the falx cerebri. Alternatively, this appearance could be due to retained intravenous contrast within the dural venous sinuses. Critical Value/emergent results were called by telephone at the time of interpretation on 01/17/2017 at 3:45 am to Dr. Pryor Curia , who verbally acknowledged these  results. Electronically Signed   By: Ulyses Jarred M.D.   On: 01/17/2017 03:47   Dg Chest Portable 1 View  Result Date: 01/17/2017 CLINICAL DATA:  Short of breath EXAM: PORTABLE CHEST 1 VIEW COMPARISON:  None. FINDINGS: Endotracheal tube tip is about 4 cm superior to the carina. Esophageal tube tip is below the diaphragm. Mild cardiomegaly. Diffuse coarse interstitial opacity. No pleural effusion. Aortic atherosclerosis. No pneumothorax. Mild asymmetric hazy opacity in the left thorax IMPRESSION: 1. Endotracheal tube tip about 4 cm superior to carina 2. Mild cardiomegaly 3. Diffuse interstitial opacity possibly chronic but superimposed acute edema or interstitial inflammation is also considered. Diffuse hazy asymmetric opacity of left thorax likely related to rotation but asymmetric edema could also produce this appearance. Electronically Signed   By: Donavan Foil M.D.   On: 01/17/2017 03:33   Dg Abd Portable 1 View  Result Date: 01/17/2017 CLINICAL DATA:  OG tube placement EXAM: PORTABLE  ABDOMEN - 1 VIEW COMPARISON:  None. FINDINGS: Esophageal tube tip and side port projects over the mid stomach. Nonobstructed gas pattern. Contrast material within the renal collecting systems and bladder. IMPRESSION: Esophageal tube tip projects over the mid stomach Electronically Signed   By: Donavan Foil M.D.   On: 01/17/2017 03:40    Impression/Plan: - Lower GI bleed in setting of IV tPA. Resolved now. Hemoglobin stable. - Stroke. Status post IV tPA. Now with the intracranial hemorrhage. See has received reversal agent for IV tPA.  Recommendations -------------------------- - Discussed with the daughter. Chart reviewed. There was initial concern for either rectal bleeding versus vaginal bleeding. RN performed perianal evaluation in front of me and there was no evidence of bleeding either from vagina or from rectum. Her hemoglobin is stable. She has received reversal agent for IV tPA. - Recommend conservative  and supportive management for now.   LOS: 0 days   Otis Brace  MD, FACP 01/17/2017, 7:59 AM  Pager [phone] If no answer or after 5 PM call [phone]

## 2017-01-17 NOTE — Progress Notes (Signed)
RT NOTE:  RT placed Albuterol 10mg  CAT on patient per order. Pt removed mark and threw setup across the room, wasting all medication. RT pulled second dose of Albuterol and mixed with Atrovent for patient. At that time MD instructed RT to hold off until after intubation to give treatment. PT was moved to Trauma B and intubated. RT returned to D35 where patient was originally located to retrieve Albuterol CAT and the mask and medication had been thrown away by house keeping and room was cleaned. Dr. Elesa MassedWard verbally reordered Albuterol @ 0400 to be given. All documentation in medication file.

## 2017-01-17 NOTE — Progress Notes (Signed)
MRI called regarding STAT order, will send for her soon.

## 2017-01-17 NOTE — ED Notes (Signed)
Tranexamic Acid infusing , ETT/OGT/Foley catheter intact , IV sites unremarkable , family at bedside .

## 2017-01-17 NOTE — ED Triage Notes (Signed)
[facility] [facility] [place] TPA pt LSW 2100 initial NIH was 7 per EMS. Pt was given 6.6mg  bolus of TPA and 56.7 TPA Drip. Finished at approx 0156 PTA. Pt also given labatelol 20mg  prior to TPA administration. Per EMS upon pulling into the hospital pt started to present with SOB, and confusion. When Pt arrived to this nurses room she is noted to have an oxygen saturation of 77% on room air and pt is bleeding from her rectum. Pt placed on nonrebreather. EDP at bedside

## 2017-01-17 NOTE — Consult Note (Signed)
Referring Physician: Dr. Elesa MassedWard    Chief Complaint: Acute stroke secondary   HPI: Terri Torres is an 83 y.o. female who [facility] late in the evening on 11/6 following acute onset of dysphasia and right hemiparesis at home. LKN was 9:00 PM. She was found to be symptomatic at 10:30 PM. Per telephone discussion with the Teleneurologist covering [facility], the patient's symptoms and objective exam findings were most consistent with a left MCA stroke. There were no contraindications to tPA and IV tPA was administered. [facility] showed a left M2 occlusion. [facility] for emergent perfusion imaging study and repeat Neurological examination to determine eligibility for possible mechanical thrombectomy. NIHSS prior to transfer was 7. Modified Rankin score per Teleneurologist was 3. At baseline, the patient ambulates with a walker.   She has a history of atrial fibrillation. She was prescribed a blood thinner in 2015 but never took it due to frequent falls.   LSN: 9:00 PM tPA Given: Yes, at OSH  PMHx: CHF Atrial fibrillation Reflux Medication noncompliance Prior episode of acute respiratory failure with hypoxia TIA (2015)  No past surgical history on file.  No family history on file. Social History:  has no tobacco, alcohol, and drug history on file.  Allergies: Allergies not on file  Home Medications:  K-Dur 20 meq qd Omeprazole 20 mg qd Nicoderm Lisinopril 5 mg qd Lasix 20 mg qd (daughter states she takes it twice per week instead of daily) Coreg 3.125 mg BID ASA 325 mg qd Vitamin D Vitamin C MVI  ROS: Shortness of breath. Unable to obtain further ROS due to agitation.   Physical Examination: SpO2 95 %.    General exam: HEENT: [place]/AT Lungs: Wheezes noted in conjunction with respiratory distress and O2 sat of 70% on RA Ext: Warm and well perfused Other: Blood in saddle region; unclear if rectal versus vaginal bleeding at time of  stroke eval  Neurologic Examination: Mental Status: Awake, agitated, in respiratory distress. Poor attention. Exhibits cognitive/communication deficit not further assessable as a true aphasia or due to agitation/delirium.  Cranial Nerves: II:  Gazes towards examiners in right and left fields of vision; unable to formally test visual fields due to agitation. Pupils equal in ambient light.  III,IV, VI: EOM are conjugate without forced gaze deviation. No nystagmus noted with eyes midposition. V,VII: Face symmetric during spontaneous speech and movement. Not cooperative with sensory exam due to agitation.  VIII: Responds to some verbal questions.  IX,X: No hypophonia or hoarseness noted XI: Head at midline initially; rotates to left and right in response to stimuli without preference XII: No lingual dysarthria noted.  Motor: Fights examiners equally on left and right with all 4 limbs when attempting to get out of bed. No asymmetry noted. Strength is 5/5 in all 4 extremities in the context of limitations to exam secondary to agitation. Bulk normal x 4. No spasticity or flaccidity noted.  Sensory: Moves extremities in response to stimuli   Deep Tendon Reflexes: Unable to perform due to agitation Cerebellar: No gross ataxia with spontaneous movements Gait: Unable to assess  No results found for this or any previous visit (from the past 48 hour(s)). No results found.  Assessment: 83 y.o. female status post IV tPA at OSH following acute onset of dysphasia with right sided weakness 1. Relative to documented prior deficts at OSH, the patient has significantly improved. Moves all 4 extremities with 5/5 strength. Exhibits cognitive/communication deficit not further  assessable as a true residual aphasia from M2 occlusion or due to agitation/delirium, but most likely the former.  2. Currently with rectal versus vaginal bleeding rated as profuse per ED staff. Most likely secondary to IV tPA.  3. Exhibits  cognitive/communication deficit not further assessable as a true aphasia or due to agitation/delirium.  4. Stroke Risk Factors - CHF, prior TIA, atrial fibrillation 5. Falls risk  Plan: 1. Given short half life of IV tPA and equivocal benefit of reversal per the literature, will hold off on reversal for now. 2. Has significantly improved relative to OSH reported NIHSS of 7. If dysphasia on exam is referable to the left M2 occlusion and not her delirium, NIHSS would currently be rated as a 1 or 2. Therefore mechanical thrombectomy is not indicated due to unacceptable risk of complications outweighing relatively limited potential benefit. In addition, she has current bleeding complications, respiratory distress and delirium, which also increase the risk of poor outcome/complications in the event of thrombectomy.  3. Will need to be admitted to the ICU after initial stabilization in the ED. Likely will need to be intubated per Dr. Elesa MassedWard.  4. Although atrial fibrillation the most likely etiology for her stroke, will need to proceed with MRI of brain and TTE to complete structural imaging studies for stroke work up.  5. No antiplatelet medications or anticoagulation for at least 24 hours following IV tPA. Consider restarting ASA or switching to anticoagulation if repeat CT at 24 hours is negative for intracranial hemorrhage, but not likely to be a good risk/benefit profile over at least the next few days unless a GI lesion can be ruled out as an additional predisposing factor for what is most likely tPA-related bleeding.  6. HgbA1c, fasting lipid panel 7. PT consult, OT consult, Speech consult 8. SBP goal of < 180. DBP goal of < 105.  9. Telemetry monitoring 10. Frequent neuro checks 11. DVT prophylaxis with SCDs  45 minutes spent in the emergent Neurological evaluation and management of this acute stroke patient  @Electronically  signed: Dr. Caryl PinaEric Marke Goodwyn@ 01/17/2017, 2:43 AM

## 2017-01-17 NOTE — Progress Notes (Signed)
STROKE TEAM PROGRESS NOTE   SUBJECTIVE (INTERVAL HISTORY) Her daughter, signs are at the bedside.  She still intubated, on sedation, nonresponsive.  Patient has been titrating down.  Had MRI showed small left BG and insular cortex infarct with right frontal small SAH.  As per family, she had atrial fibrillation not on anticoagulation, had 3 falls for the last 6 years.  Patient and family options no anticoagulation due to no specific antidote for factor X inhibitor in the past, and tedious for Coumadin use.    Patient was intubated for respiratory distress during transportation from outside hospital to [facility].  Patient had a lower GI bleeding after TPA, however, currently resolved.  She received TPA reversal.  GI service consulted, no specific concerns. CBC stable.   OBJECTIVE Temp:  [97.3 F (36.3 C)-98.4 F (36.9 C)] 98.4 F (36.9 C) (11/07 1556) Pulse Rate:  [71-100] 97 (11/07 1600) Cardiac Rhythm: Atrial fibrillation (11/07 1400) Resp:  [16-22] 20 (11/07 1600) BP: (103-164)/(58-95) 137/71 (11/07 1600) SpO2:  [95 %-100 %] 100 % (11/07 1600) FiO2 (%):  [40 %-100 %] 40 % (11/07 1504) Weight:  [155 lb (70.3 kg)-156 lb 15.5 oz (71.2 kg)] 156 lb 15.5 oz (71.2 kg) (11/07 0630)  Recent Labs  Lab 01/17/17 0234 01/17/17 0355 01/17/17 1249 01/17/17 1533  GLUCAP 144* 179* 148* 113*   Recent Labs  Lab 01/17/17 0347 01/17/17 0410  NA 138 140  K 4.1 4.1  CL 106 106  CO2 23  --   GLUCOSE 183* 182*  BUN 13 15  CREATININE 0.88 0.80  CALCIUM 8.9  --   MG 1.7  --   PHOS 4.8*  --    Recent Labs  Lab 01/17/17 0347  AST 30  ALT 16  ALKPHOS 91  BILITOT 0.7  PROT 6.2*  ALBUMIN 3.1*   Recent Labs  Lab 01/17/17 0347 01/17/17 0410  WBC 8.6  --   NEUTROABS 6.3  --   HGB 12.1 12.2  HCT 39.1 36.0  MCV 91.8  --   PLT 221  --    No results for input(s): CKTOTAL, CKMB, CKMBINDEX, TROPONINI in the last 168 hours. Recent Labs    01/17/17 0347 01/17/17 1159  LABPROT 13.9 13.4   INR 1.08 1.03   Recent Labs    01/17/17 0413  COLORURINE YELLOW  LABSPEC >1.046*  PHURINE 5.0  GLUCOSEU NEGATIVE  HGBUR NEGATIVE  BILIRUBINUR NEGATIVE  KETONESUR NEGATIVE  PROTEINUR NEGATIVE  NITRITE NEGATIVE  LEUKOCYTESUR NEGATIVE       Component Value Date/Time   TRIG 115 01/17/2017 0625   No results found for: HGBA1C No results found for: LABOPIA, COCAINSCRNUR, LABBENZ, AMPHETMU, THCU, LABBARB  No results for input(s): ETH in the last 168 hours.  I have personally reviewed the radiological images below and agree with the radiology interpretations.  Ct Head Wo Contrast 01/17/2017 IMPRESSION: 1. Right frontal convexity acute subarachnoid hemorrhage without mass effect or hydrocephalus. 2. Mildly thickened appearance of the posterosuperior sagittal sinus may indicate a very small component of subdural blood adjacent to the falx cerebri. Alternatively, this appearance could be due to retained intravenous contrast within the dural venous sinuses.   Mri and Mra Head Wo Contrast 01/17/2017 IMPRESSION: 1. Small acute left MCA infarcts. 2. Right-sided subarachnoid hemorrhage as seen on CT. Suspected small amount of acute cortical/subcortical infarction in the anterior right frontal operculum region, though much of the abnormal diffusion signal in the right hemisphere is attributed to hemorrhage. 3. Moderate chronic small  vessel ischemic disease and cerebral atrophy. 4. No large vessel occlusion. Partial recanalization of previously described left M2 branch vessel occlusion.   CT repeat pending  2D Echocardiogram   - Left ventricle: The cavity size was normal. There was mild   concentric hypertrophy. Systolic function was severely reduced.   The estimated ejection fraction was in the range of 25% to 30%.   Diffuse hypokinesis. - Ventricular septum: Septal motion showed abnormal function and   dyssynchrony. - Aortic valve: Transvalvular velocity was within the normal range.    There was no stenosis. There was no regurgitation. - Mitral valve: Transvalvular velocity was within the normal range.   There was no evidence for stenosis. There was no regurgitation. - Left atrium: The atrium was moderately to severely dilated. - Right ventricle: The cavity size was normal. Wall thickness was   normal. Systolic function was normal. - Right atrium: The atrium was moderately dilated. - Atrial septum: A patent foramen ovale cannot be excluded. - Tricuspid valve: There was mild regurgitation. - Pulmonary arteries: Systolic pressure was within the normal   range. PA peak pressure: 36 mm Hg (S).   PHYSICAL EXAM  Temp:  [97.3 F (36.3 C)-98.4 F (36.9 C)] 98.4 F (36.9 C) (11/07 1556) Pulse Rate:  [71-100] 97 (11/07 1600) Resp:  [16-22] 20 (11/07 1600) BP: (103-164)/(58-95) 137/71 (11/07 1600) SpO2:  [95 %-100 %] 100 % (11/07 1600) FiO2 (%):  [40 %-100 %] 40 % (11/07 1504) Weight:  [155 lb (70.3 kg)-156 lb 15.5 oz (71.2 kg)] 156 lb 15.5 oz (71.2 kg) (11/07 0630)  General - Well nourished, well developed, intubated on sedation.  Ophthalmologic - fundi not visualized due to noncooperation.  Cardiovascular - irregularly irregular heart rate and rhythm.  Neuro -intubated, on sedation, nonresponsive, not open eyes or follow commands.  On forced eyelid opening, ice medial position, no doll's eyes, weak corneal on the left, absent corneal on the right, gag positive.  Breathing over the vent, on pain stimulation, bilateral upper extremity able to against gravity, bilateral lower extremity withdraws to pain, symmetrical bilaterally.  DTRs 1+, no Babinski. Sensation, coordination and gait not tested.  ASSESSMENT/PLAN Terri Torres is a 83 y.o. female with history of A. fib not on anticoagulation, CHF on aspirin, dementia admitted for slurred speech, right-sided weakness. tPA given due to an outside hospital.    Stroke:  left BG and insular cortex small infarcts embolic  secondary to A. fib not on anticoagulation.   Resultant intubated, on sedation, moving all extremities symmetrically  CTA head and neck left M2 occlusion  Repeat CT after TPA -right frontal SAH  MRI left BG and intracardiac small infarcts, and right frontal SAH  MRA motion degraded, partially recanalization of left M2  2D Echo EF 25-30%  LDL Pending  HgbA1c pending  SCDs for VTE prophylaxis  Diet NPO time specified   aspirin 81 mg daily prior to admission, now on No antithrombotic [facility].  Patient will need anticoagulation for stroke prevention, however, patient family has some hesitation on it.  They agreed to revisit this issue after patient medically stable.  Ongoing aggressive stroke risk factor management  Therapy recommendations: Pending  Disposition: Pending  SAH: Right frontal status post TPA  Likely related to TPA use  TPA reversed with tranexamic acid  MRI brain showed stable right frontal SAH  Hold off antiplatelet or anticoagulation at this time  GI bleeding status post TPA  Rectal bleeding after TPA  Resolved before  GI service consultation  No specific concern from GI service  CBC stable  Chronic A. fib not on anticoagulation  History of fall, but infrequent  In the past, family refused Coumadin due to tedious monitoring and no antidote for factor Xa inhibitors  Currently, patient family has some hesitation to start anticoagulation, but agreed to revisit the issue after patient medically stable  CHF with low EF  2D echo showed EF 25-30%  On lasix and coreg at home  CCM on board  Will discuss with family about anticoagulation when medically stable  Hypertension Stable Permissive hypertension (OK if <180/105) for 24-48 hours post stroke and then gradually normalized within 5-7 days.  Long term BP goal normotensive  Hyperlipidemia  Home meds: None  LDL pending, goal < 70  Other Stroke Risk Factors  Advanced age  Other  Active Problems  Dementia -lives at home with daughter, need help for daily activities such as bathing, dressing, however able to walk with his walker inside the house, seldomly go outside hospital.  Hospital day # 0  This patient is critically ill due to left MCA infarct, right frontal SAH, GI bleeding status post TPA, cardiomyopathy, low EF, and dementia and at significant risk of neurological worsening, death form recurrent infarct, hemorrhagic conversion, vasospasm, anemia, shock, heart failure, seizure. This patient's care requires constant monitoring of vital signs, hemodynamics, respiratory and cardiac monitoring, review of multiple databases, neurological assessment, discussion with family, other specialists and medical decision making of high complexity. I had long discussion with daughter and sons at bedside, updated pt current condition, treatment plan and potential prognosis. They expressed understanding and appreciation.  I spent 45 minutes of neurocritical care time in the care of this patient.   Marvel Plan, MD PhD Stroke Neurology 01/17/2017 4:36 PM    To contact Stroke Continuity provider, please refer to [email or web address]. After hours, contact General Neurology

## 2017-01-17 NOTE — Progress Notes (Addendum)
Blood noted in OG tube, OG to LIS, MD aware. Has since cleared up, no intervention.

## 2017-01-17 NOTE — ED Notes (Signed)
Dr. Merlene PullingHammonds at bedside evaluating pt, explained plan of care to patient's daughter.

## 2017-01-17 NOTE — H&P (Signed)
PULMONARY / CRITICAL CARE MEDICINE   Name: Terri Torres MRN: [id number] DOB: 11/24/1930    ADMISSION DATE:  01/17/2017 CONSULTATION DATE: 11/7  REFERRING MD: Dr. Elesa MassedWard EDP  CHIEF COMPLAINT: Subarachnoid hemorrhage  HISTORY OF PRESENT ILLNESS:   83 year old female with past medical history as below, which is significant for atrial fibrillation with rate control.  She was initially ordered DOAC for anticoagulation, but ended up not taking this due to falls.  After a fall about a year ago when she broke her wrist her daughter has been living with her.  Her daughter notices some memory changes recently, otherwise she remains in her usual state of health until 11/6 in the late evening.  She developed difficulty speaking and was [facility] angiogram described a left M2 occlusion and she was given TPA.  [facility] perfusion study and consideration of interventional radiology procedure.  [facility] the patient had developed shortness of breath and hypoxemia with worsening mental status.  She was intubated in the emergency department.  CT scan was repeated demonstrating a right frontal acute subarachnoid hemorrhage and a small component of subdural bleeding as well.  There is no mass-effect identified.  PCCM asked to admit the ventilated patient.  PAST MEDICAL HISTORY :  She  has a past medical history of A-fib (HCC).  PAST SURGICAL HISTORY: She  has a past surgical history that includes Femur fracture surgery and Wrist fracture surgery.  Allergies  Allergen Reactions  . Macrobid [Nitrofurantoin Macrocrystal] Shortness Of Breath    No current facility-administered medications on file prior to encounter.    Current Outpatient Medications on File Prior to Encounter  Medication Sig  . aspirin 81 MG chewable tablet Chew 81 mg daily by mouth.  . carvedilol (COREG) 3.125 MG tablet Take 3.125 mg 2 (two) times  daily by mouth.  . furosemide (LASIX) 20 MG tablet Take 20 mg 2 (two) times a week by mouth.   . Multiple Vitamins-Minerals (MULTIVITAMIN ADULT) CHEW Chew 2 tablets daily by mouth.  [name]. omeprazole (PRILOSEC) 20 MG capsule Take 20 mg daily by mouth.    FAMILY HISTORY:  Her has no family status information on file.    SOCIAL HISTORY: She    REVIEW OF SYSTEMS:   Unable as patient is encephalopathic and intubated  SUBJECTIVE:    VITAL SIGNS: BP (!) 143/76   Pulse 83   Temp 97.6 F (36.4 C) (Temporal)   Resp 18   Ht 5\' 5"  (1.651 m)   Wt 70.3 kg (155 lb)   SpO2 99%   BMI 25.79 kg/m   HEMODYNAMICS:    VENTILATOR SETTINGS: Vent Mode: PRVC FiO2 (%):  [60 %-100 %] 60 % Set Rate:  [18 bmp] 18 bmp Vt Set:  [480 mL-520 mL] 520 mL PEEP:  [5 cmH20] 5 cmH20 Plateau Pressure:  [19 cmH20-22 cmH20] 19 cmH20  INTAKE / OUTPUT: No intake/output data recorded.  PHYSICAL EXAMINATION: General: Elderly female on ventilator in no acute distress Neuro: Sedated, moving right upper extremity non-purposefully. HEENT: Normocephalic/atraumatic, pupils equal, reactive Cardiovascular: Irregularly irregular, rate controlled Lungs: Clear bilateral breath sounds Abdomen: Soft, nontender, nondistended Musculoskeletal: No acute deformity Skin: Grossly intact  LABS:  BMET Recent Labs  Lab 01/17/17 0410  NA 140  K 4.1  CL 106  BUN 15  CREATININE 0.80  GLUCOSE 182*    Electrolytes No results for input(s): CALCIUM, MG, PHOS in the  last 168 hours.  CBC Recent Labs  Lab 01/17/17 0347 01/17/17 0410  WBC 8.6  --   HGB 12.1 12.2  HCT 39.1 36.0  PLT 221  --     Coag's No results for input(s): APTT, INR in the last 168 hours.  Sepsis Markers No results for input(s): LATICACIDVEN, PROCALCITON, O2SATVEN in the last 168 hours.  ABG Recent Labs  Lab 01/17/17 0350  PHART 7.332*  PCO2ART 54.3*  PO2ART 444.0*    Liver Enzymes No results for input(s): AST, ALT, ALKPHOS, BILITOT,  ALBUMIN in the last 168 hours.  Cardiac Enzymes No results for input(s): TROPONINI, PROBNP in the last 168 hours.  Glucose Recent Labs  Lab 01/17/17 0355  GLUCAP 179*    Imaging Ct Head Wo Contrast  Result Date: 01/17/2017 CLINICAL DATA:  Status post tPA administration. Altered mental status. EXAM: CT HEAD WITHOUT CONTRAST TECHNIQUE: Contiguous axial images were obtained from the base of the skull through the vertex without intravenous contrast. COMPARISON:  CTA head and neck 01/17/2017. FINDINGS: Brain: There is acute subarachnoid hemorrhage overlying the right frontal convexity. Small amount of hyperdensity along the posterior falx cerebri is also noted. No intraparenchymal hematoma. There is periventricular hypoattenuation compatible with chronic microvascular disease. Ribs a small superior parafalcine meningioma measuring 8 x 6 mm. Vascular: There is mild intravascular enhancement secondary to contrast-enhanced study performed earlier. Skull: Normal visualized skull base, calvarium and extracranial soft tissues. Sinuses/Orbits: No sinus fluid levels or advanced mucosal thickening. No mastoid effusion. Normal orbits. IMPRESSION: 1. Right frontal convexity acute subarachnoid hemorrhage without mass effect or hydrocephalus. 2. Mildly thickened appearance of the posterosuperior sagittal sinus may indicate a very small component of subdural blood adjacent to the falx cerebri. Alternatively, this appearance could be due to retained intravenous contrast within the dural venous sinuses. Critical Value/emergent results were called by telephone at the time of interpretation on 01/17/2017 at 3:45 am to Dr. Rochele RaringKRISTEN WARD , who verbally acknowledged these results. Electronically Signed   By: Deatra RobinsonKevin  Herman M.D.   On: 01/17/2017 03:47   Dg Chest Portable 1 View  Result Date: 01/17/2017 CLINICAL DATA:  Short of breath EXAM: PORTABLE CHEST 1 VIEW COMPARISON:  None. FINDINGS: Endotracheal tube tip is about 4 cm  superior to the carina. Esophageal tube tip is below the diaphragm. Mild cardiomegaly. Diffuse coarse interstitial opacity. No pleural effusion. Aortic atherosclerosis. No pneumothorax. Mild asymmetric hazy opacity in the left thorax IMPRESSION: 1. Endotracheal tube tip about 4 cm superior to carina 2. Mild cardiomegaly 3. Diffuse interstitial opacity possibly chronic but superimposed acute edema or interstitial inflammation is also considered. Diffuse hazy asymmetric opacity of left thorax likely related to rotation but asymmetric edema could also produce this appearance. Electronically Signed   By: Jasmine PangKim  Fujinaga M.D.   On: 01/17/2017 03:33   Dg Abd Portable 1 View  Result Date: 01/17/2017 CLINICAL DATA:  OG tube placement EXAM: PORTABLE ABDOMEN - 1 VIEW COMPARISON:  None. FINDINGS: Esophageal tube tip and side port projects over the mid stomach. Nonobstructed gas pattern. Contrast material within the renal collecting systems and bladder. IMPRESSION: Esophageal tube tip projects over the mid stomach Electronically Signed   By: Jasmine PangKim  Fujinaga M.D.   On: 01/17/2017 03:40     STUDIES:  CT head 11/7> right frontal convexity acute subarachnoid hemorrhage without mass-effect or hydrocephalus.  Mildly thickened appearance of the posterosuperior sagittal sinus may indicate a very small component of subdural blood adjacent to the falx cerebri.  Alternatively this appearance  could be due to retained intravenous contrast within the dural venous sinuses.  CULTURES:   ANTIBIOTICS:   SIGNIFICANT EVENTS: 11/7 admit  LINES/TUBES: ETT 11/7 >  DISCUSSION: 83 year old female with known atrial fibrillation not on anticoagulation presented to outside hospital emergency department with dysarthria.  Found to have M2 occlusion given TPA, however, suffered mild hemorrhagic conversion and had TPA reversed.  She was intubated for hypoxia and airway protection in the emergency department.  ASSESSMENT /  PLAN:  PULMONARY A: Acute hypoxemic respiratory failure Possible pulmonary edema  P:   Full ventilator support Follow ABG Intermittent chest x-ray Ventilator associated pneumonia prevention per protocol Diuresed in ED, monitor I&O, weights.   CARDIOVASCULAR A:  Atrial fibrillation History of hypertension ? CHF exacerbation, no history  P:  Systolic blood pressure goal 140-160 ICU hemodynamic monitoring Echo I&O  RENAL A:   No acute issues  P:   Follow BMP   GASTROINTESTINAL A:   No acute issues  P:   NPO PPI  HEMATOLOGIC A:   No acute issues  P:  Follow CBC Holding VTE chemoprophylaxis  INFECTIOUS A:   No acute issues P:   Follow WBC and fever curve  ENDOCRINE A:   Hyperglycemia without DM history    P:   Follow glucose, start SSI if consecutive values > 180  NEUROLOGIC A:   R frontal SAH Possible SDH component as well CVA Acute metabolic encephalopathy P:   RASS goal: 0 to -1 Propofol infusion PRN fentanyl Neurology following.  TPA reversed in ED with TXA   FAMILY  - Updates: Daughter updated bedside  - Inter-disciplinary family meet or Palliative Care meeting due by:  11/14   Joneen Roach, AGACNP-BC [place] Pulmonology/Critical Care Pager [phone] or [phone]  01/17/2017 5:06 AM

## 2017-01-17 NOTE — Progress Notes (Signed)
RT NOTE:  Pt transported to 4N29 without event. Report given to [facility], RRT.

## 2017-01-17 NOTE — Progress Notes (Signed)
RT NOTE:  Pt transported to CT1 and back without event.

## 2017-01-17 NOTE — Progress Notes (Signed)
  SLP Cancellation Note  Patient Details Name: Terri EdwardsWilma Torres MRN: [id number] DOB: 09/29/1930   Cancelled treatment:       Reason Eval/Treat Not Completed: Medical issues which prohibited therapy(Intubated. Will f/u 11/8. )  Ferdinand LangoLeah Lemmie Vanlanen MA, CCC-SLP [phone]  Benedict Kue Meryl 01/17/2017, 9:30 AM

## 2017-01-17 NOTE — Progress Notes (Signed)
  Echocardiogram 2D Echocardiogram has been performed.  Terri Torres T Terri Torres 01/17/2017, 1:10 PM

## 2017-01-17 NOTE — Progress Notes (Signed)
OT Cancellation Note  Patient Details Name: Terri EdwardsWilma Torres MRN: [id number] DOB: 01/30/1931   Cancelled Treatment:    Reason Eval/Treat Not Completed: Patient not medically ready. RN requesting hold; will follow up for OT eval as time allows and pt is medically appropriate.  Gaye AlkenBailey A Tammi Boulier M.S., OTR/L Pager: [phone]  01/17/2017, 1:19 PM

## 2017-01-17 NOTE — Progress Notes (Signed)
Repeat CT head reveals small subarachnoid hemorrhage along the anterior frontal convexity and possible subdural hemorrhage along the posterior falx cerebri. The patient also has a lower GI bleed.  After discussion of therapeutic options in the context of tPA related hemorrhages at 2 different anatomical locations, Dr. Elesa MassedWard and I have reached consensus that treatment with tranexamic acid is necessary to reverse tPA effect and prevent further hemorrhage.   Electronically signed: Dr. Caryl PinaEric Jessey Huyett

## 2017-01-17 NOTE — ED Notes (Signed)
Returned from CT scan , ETT/OGT intact , IV sites unremarkable , Propofol drip infusing at 10 mcg/min .

## 2017-01-17 NOTE — Progress Notes (Signed)
PT Cancellation Note  Patient Details Name: Terri EdwardsWilma Torres MRN: [id number] DOB: 03/28/1930   Cancelled Treatment:    Reason Eval/Treat Not Completed: Patient at procedure or test/unavailable(at MRI will follow)   Fabio Asaevon J Patrizia Paule 01/17/2017, 10:02 AM [place] Crumb[place] Gangemi, PT DPT  Board Certified Neurologic Specialist [phone]

## 2017-01-18 ENCOUNTER — Inpatient Hospital Stay (HOSPITAL_COMMUNITY): Payer: Medicare Other

## 2017-01-18 DIAGNOSIS — I10 Essential (primary) hypertension: Secondary | ICD-10-CM

## 2017-01-18 LAB — BLOOD GAS, ARTERIAL
Acid-Base Excess: 4.7 mmol/L — ABNORMAL HIGH (ref 0.0–2.0)
Acid-Base Excess: 4.8 mmol/L — ABNORMAL HIGH (ref 0.0–2.0)
Bicarbonate: 26.7 mmol/L (ref 20.0–28.0)
Bicarbonate: 28.1 mmol/L — ABNORMAL HIGH (ref 20.0–28.0)
Drawn by: 577021
Drawn by: 105521
FIO2: 40
FIO2: 40
MECHVT: 520 mL
O2 Saturation: 98.8 %
O2 Saturation: 99.1 %
PEEP: 15 cmH2O
PEEP: 5 cmH2O
Patient temperature: 98.6
Patient temperature: 98.6
RATE: 18 resp/min
pCO2 arterial: 26.1 mmHg — ABNORMAL LOW (ref 32.0–48.0)
pCO2 arterial: 36.3 mmHg (ref 32.0–48.0)
pH, Arterial: 7.614 (ref 7.350–7.450)
pH, Arterial: 7.5 — ABNORMAL HIGH (ref 7.350–7.450)
pO2, Arterial: 122 mmHg — ABNORMAL HIGH (ref 83.0–108.0)
pO2, Arterial: 154 mmHg — ABNORMAL HIGH (ref 83.0–108.0)

## 2017-01-18 LAB — GLUCOSE, CAPILLARY: GLUCOSE-CAPILLARY: 109 mg/dL — AB (ref 65–99)

## 2017-01-18 LAB — BASIC METABOLIC PANEL
ANION GAP: 10 (ref 5–15)
BUN: 17 mg/dL (ref 6–20)
CHLORIDE: 103 mmol/L (ref 101–111)
CO2: 26 mmol/L (ref 22–32)
Calcium: 9.4 mg/dL (ref 8.9–10.3)
Creatinine, Ser: 0.96 mg/dL (ref 0.44–1.00)
GFR calc non Af Amer: 53 mL/min — ABNORMAL LOW (ref >60)
GLUCOSE: 111 mg/dL — AB (ref 65–99)
Potassium: 3.4 mmol/L — ABNORMAL LOW (ref 3.5–5.1)
Sodium: 139 mmol/L (ref 135–145)

## 2017-01-18 LAB — CBC WITH DIFFERENTIAL/PLATELET
Basophils Absolute: 0 10*3/uL (ref 0.0–0.1)
Basophils Relative: 0 %
EOS PCT: 0 %
Eosinophils Absolute: 0 10*3/uL (ref 0.0–0.7)
HCT: 36.4 % (ref 36.0–46.0)
HEMOGLOBIN: 12.6 g/dL (ref 12.0–15.0)
LYMPHS ABS: 2.3 10*3/uL (ref 0.7–4.0)
LYMPHS PCT: 12 %
MCH: 30.1 pg (ref 26.0–34.0)
MCHC: 34.6 g/dL (ref 30.0–36.0)
MCV: 87.1 fL (ref 78.0–100.0)
MONOS PCT: 8 %
Monocytes Absolute: 1.6 10*3/uL — ABNORMAL HIGH (ref 0.1–1.0)
NEUTROS PCT: 80 %
Neutro Abs: 15.6 10*3/uL — ABNORMAL HIGH (ref 1.7–7.7)
Platelets: 238 10*3/uL (ref 150–400)
RBC: 4.18 MIL/uL (ref 3.87–5.11)
RDW: 13.1 % (ref 11.5–15.5)
WBC: 19.5 10*3/uL — AB (ref 4.0–10.5)

## 2017-01-18 LAB — LIPID PANEL
Cholesterol: 176 mg/dL (ref 0–200)
HDL: 58 mg/dL (ref >40)
LDL Cholesterol: 101 mg/dL — ABNORMAL HIGH (ref 0–99)
Total CHOL/HDL Ratio: 3 RATIO
Triglycerides: 83 mg/dL (ref <150)
VLDL: 17 mg/dL (ref 0–40)

## 2017-01-18 LAB — PHOSPHORUS: Phosphorus: 3.2 mg/dL (ref 2.5–4.6)

## 2017-01-18 LAB — HEMOGLOBIN A1C
HEMOGLOBIN A1C: 5.4 % (ref 4.8–5.6)
MEAN PLASMA GLUCOSE: 108.28 mg/dL

## 2017-01-18 LAB — MAGNESIUM: Magnesium: 1.6 mg/dL — ABNORMAL LOW (ref 1.7–2.4)

## 2017-01-18 MED ORDER — HEPARIN SODIUM (PORCINE) 5000 UNIT/ML IJ SOLN
5000.0000 [IU] | Freq: Two times a day (BID) | INTRAMUSCULAR | Status: DC
  Administered 2017-01-18 – 2017-01-20 (×4): 5000 [IU] via SUBCUTANEOUS
  Filled 2017-01-18 (×5): qty 1

## 2017-01-18 MED ORDER — ATORVASTATIN CALCIUM 40 MG PO TABS: 40.0000 mg | ORAL_TABLET | Freq: Every day | ORAL | Status: DC

## 2017-01-18 MED ORDER — LABETALOL HCL 5 MG/ML IV SOLN
5.0000 mg | INTRAVENOUS | Status: DC | PRN
  Administered 2017-01-18: 5 mg via INTRAVENOUS
  Filled 2017-01-18: qty 4

## 2017-01-18 MED ORDER — ORAL CARE MOUTH RINSE
15.0000 mL | Freq: Two times a day (BID) | OROMUCOSAL | Status: DC
  Administered 2017-01-18 – 2017-01-19 (×2): 15 mL via OROMUCOSAL

## 2017-01-18 MED ORDER — POTASSIUM CHLORIDE 10 MEQ/100ML IV SOLN
10.0000 meq | INTRAVENOUS | Status: AC
  Administered 2017-01-18 (×3): 10 meq via INTRAVENOUS
  Filled 2017-01-18 (×3): qty 100

## 2017-01-18 MED ORDER — HYDROCHLOROTHIAZIDE 25 MG PO TABS
25.0000 mg | ORAL_TABLET | Freq: Every day | ORAL | Status: DC
  Administered 2017-01-18 – 2017-01-20 (×3): 25 mg via ORAL
  Filled 2017-01-18 (×3): qty 1

## 2017-01-18 MED ORDER — CARVEDILOL 3.125 MG PO TABS
3.1250 mg | ORAL_TABLET | Freq: Two times a day (BID) | ORAL | Status: DC
  Administered 2017-01-18: 3.125 mg via ORAL
  Filled 2017-01-18: qty 1

## 2017-01-18 MED ORDER — ATORVASTATIN CALCIUM 10 MG PO TABS
20.0000 mg | ORAL_TABLET | Freq: Every day | ORAL | Status: DC
  Administered 2017-01-19 – 2017-01-20 (×2): 20 mg via ORAL
  Filled 2017-01-18 (×2): qty 2

## 2017-01-18 MED ORDER — CARVEDILOL 6.25 MG PO TABS
6.2500 mg | ORAL_TABLET | Freq: Two times a day (BID) | ORAL | Status: DC
  Administered 2017-01-19 – 2017-01-20 (×4): 6.25 mg via ORAL
  Filled 2017-01-18 (×4): qty 1

## 2017-01-18 NOTE — Progress Notes (Signed)
STROKE TEAM PROGRESS NOTE   SUBJECTIVE (INTERVAL HISTORY) Her daughter is at the bedside. Pt was extubated this am, tolerating well. pending speech evaluation. She still has speech difficulty, aphasia vs. Severe dysarthria vs. Dementia. Moving all extremities well.  CT repeat midnight showed stable SAH, no new bleeding.    OBJECTIVE Temp:  [97.6 F (36.4 C)-99.1 F (37.3 C)] 99.1 F (37.3 C) (11/08 1206) Pulse Rate:  [54-113] 101 (11/08 1200) Cardiac Rhythm: Atrial fibrillation (11/08 0800) Resp:  [13-23] 21 (11/08 1200) BP: (99-155)/(45-99) 143/80 (11/08 1200) SpO2:  [97 %-100 %] 99 % (11/08 1200) FiO2 (%):  [40 %] 40 % (11/08 0751) Weight:  [151 lb 14.4 oz (68.9 kg)] 151 lb 14.4 oz (68.9 kg) (11/08 0411)  Recent Labs  Lab 01/17/17 1249 01/17/17 1533 01/17/17 2001 01/17/17 2350 01/18/17 1149  GLUCAP 148* 113* 116* 113* 109*   Recent Labs  Lab 01/17/17 0347 01/17/17 0410 01/18/17 0337  NA 138 140 139  K 4.1 4.1 3.4*  CL 106 106 103  CO2 23  --  26  GLUCOSE 183* 182* 111*  BUN 13 15 17   CREATININE 0.88 0.80 0.96  CALCIUM 8.9  --  9.4  MG 1.7  --  1.6*  PHOS 4.8*  --  3.2   Recent Labs  Lab 01/17/17 0347  AST 30  ALT 16  ALKPHOS 91  BILITOT 0.7  PROT 6.2*  ALBUMIN 3.1*   Recent Labs  Lab 01/17/17 0347 01/17/17 0410 01/18/17 0337  WBC 8.6  --  19.5*  NEUTROABS 6.3  --  15.6*  HGB 12.1 12.2 12.6  HCT 39.1 36.0 36.4  MCV 91.8  --  87.1  PLT 221  --  238   No results for input(s): CKTOTAL, CKMB, CKMBINDEX, TROPONINI in the last 168 hours. Recent Labs    01/17/17 0347 01/17/17 1159  LABPROT 13.9 13.4  INR 1.08 1.03   Recent Labs    01/17/17 0413  COLORURINE YELLOW  LABSPEC >1.046*  PHURINE 5.0  GLUCOSEU NEGATIVE  HGBUR NEGATIVE  BILIRUBINUR NEGATIVE  KETONESUR NEGATIVE  PROTEINUR NEGATIVE  NITRITE NEGATIVE  LEUKOCYTESUR NEGATIVE       Component Value Date/Time   CHOL 176 01/18/2017 0337   TRIG 83 01/18/2017 0337   HDL 58 01/18/2017  0337   CHOLHDL 3.0 01/18/2017 0337   VLDL 17 01/18/2017 0337   LDLCALC 101 (H) 01/18/2017 0337   Lab Results  Component Value Date   HGBA1C 5.4 01/18/2017   No results found for: LABOPIA, COCAINSCRNUR, LABBENZ, AMPHETMU, THCU, LABBARB  No results for input(s): ETH in the last 168 hours.  I have personally reviewed the radiological images below and agree with the radiology interpretations.  Ct Head Wo Contrast 01/17/2017 IMPRESSION: 1. Right frontal convexity acute subarachnoid hemorrhage without mass effect or hydrocephalus. 2. Mildly thickened appearance of the posterosuperior sagittal sinus may indicate a very small component of subdural blood adjacent to the falx cerebri. Alternatively, this appearance could be due to retained intravenous contrast within the dural venous sinuses.   Mri and Mra Head Wo Contrast 01/17/2017 IMPRESSION: 1. Small acute left MCA infarcts. 2. Right-sided subarachnoid hemorrhage as seen on CT. Suspected small amount of acute cortical/subcortical infarction in the anterior right frontal operculum region, though much of the abnormal diffusion signal in the right hemisphere is attributed to hemorrhage. 3. Moderate chronic small vessel ischemic disease and cerebral atrophy. 4. No large vessel occlusion. Partial recanalization of previously described left M2 branch vessel occlusion.  Ct Head Wo Contrast 01/18/2017 IMPRESSION: 1. Unchanged appearance of right frontal lobe subarachnoid hemorrhage. No new hemorrhage identified. 2. Hypoattenuation in the left caudate head consistent with known infarct. No midline shift or hydrocephalus.   2D Echocardiogram   - Left ventricle: The cavity size was normal. There was mild   concentric hypertrophy. Systolic function was severely reduced.   The estimated ejection fraction was in the range of 25% to 30%.   Diffuse hypokinesis. - Ventricular septum: Septal motion showed abnormal function and   dyssynchrony. - Aortic valve:  Transvalvular velocity was within the normal range.   There was no stenosis. There was no regurgitation. - Mitral valve: Transvalvular velocity was within the normal range.   There was no evidence for stenosis. There was no regurgitation. - Left atrium: The atrium was moderately to severely dilated. - Right ventricle: The cavity size was normal. Wall thickness was   normal. Systolic function was normal. - Right atrium: The atrium was moderately dilated. - Atrial septum: A patent foramen ovale cannot be excluded. - Tricuspid valve: There was mild regurgitation. - Pulmonary arteries: Systolic pressure was within the normal   range. PA peak pressure: 36 mm Hg (S).   PHYSICAL EXAM  Temp:  [97.6 F (36.4 C)-99.1 F (37.3 C)] 99.1 F (37.3 C) (11/08 1206) Pulse Rate:  [54-113] 101 (11/08 1200) Resp:  [13-23] 21 (11/08 1200) BP: (99-155)/(45-99) 143/80 (11/08 1200) SpO2:  [97 %-100 %] 99 % (11/08 1200) FiO2 (%):  [40 %] 40 % (11/08 0751) Weight:  [151 lb 14.4 oz (68.9 kg)] 151 lb 14.4 oz (68.9 kg) (11/08 0411)  General - Well nourished, well developed, extubated, not in distress  Ophthalmologic - fundi not visualized due to noncooperation.  Cardiovascular - irregularly irregular heart rate and rhythm, with intermittent RVR  Neuro - awake alert, eyes open, able to close eyes on command only, and then perseverated on further commands. Able to have words out, but does not make sense, hypophonia, severe dysarthria, PERRL, attending to both sides, tracking objects, no significant facial droop, tongue in midline. Symmetrical bilateral upper extremity against gravity, at least 3+/5, bilateral lower extremity withdraws to pain, able to hold with knee flexion, left LE mild pain due to previous hip fracture s/p screws. Symmetrical bilaterally.  DTRs 1+, no Babinski. Sensation, coordination not cooperative and gait not tested.  ASSESSMENT/PLAN Terri Torres is a 83 y.o. female with history of A.  fib not on anticoagulation, CHF on aspirin, dementia admitted for slurred speech, right-sided weakness. tPA given due to an outside hospital.    Stroke:  left BG and insular cortex small infarcts embolic secondary to A. fib not on anticoagulation.  Resultant ? Aphasia vs. Dysarthria   CTA head and neck left M2 occlusion, proximal ICAs widely patent  Repeat CT after TPA - right frontal SAH  MRI left BG and intracardiac small infarcts, and right frontal SAH  MRA motion degraded, partially recanalization of left M2  2D Echo EF 25-30%  LDL 101  HgbA1c 5.4  SCDs for VTE prophylaxis  Diet NPO time specified   aspirin 81 mg daily prior to admission, now on [facility]  Patient will need anticoagulation for stroke prevention, however, [facility].  Ongoing aggressive stroke risk factor management  Therapy recommendations: Pending  Disposition: Pending  SAH: Right frontal status post TPA  Likely related to TPA use  TPA reversed with tranexamic acid  MRI brain  showed stable right frontal SAH  Repeat CT 01/18/18 - unchanged SAH  Hold off antiplatelet or anticoagulation at this time  GI bleeding status post TPA  Rectal bleeding after TPA  Resolved before GI service consultation  No specific concern from GI service  CBC stable  Chronic A. fib not on anticoagulation  History of fall, but infrequent  In the past, family refused Xarelto or Eliquis due to no antidote  Currently, patient family agreed to consider eliquis after SAH resolves  CHF with low EF  2D echo showed EF 25-30%  On HCTZ and coreg at home  CCM on board  Will discuss with family about anticoagulation [facility]  Hypertension Stable  BP goal normotensive  Hyperlipidemia  Home meds: None  LDL 101, goal < 70  Add lipitor 20  Continue statin on discharge.  Other Stroke Risk Factors  Advanced age  Other Active  Problems  Dementia -lives at home with daughter, need help for daily activities such as bathing, dressing, however able to walk with his walker inside the house, seldomly go outside hospital.  Leukocytosis - 8.6->19.5 (afebrile)  Hospital day # 1  This patient is critically ill due to left MCA infarct, right frontal SAH, GI bleeding status post TPA, cardiomyopathy, low EF, and dementia and at significant risk of neurological worsening, death form recurrent infarct, hemorrhagic conversion, vasospasm, anemia, shock, heart failure, seizure. This patient's care requires constant monitoring of vital signs, hemodynamics, respiratory and cardiac monitoring, review of multiple databases, neurological assessment, discussion with family, other specialists and medical decision making of high complexity. I had long discussion with daughter and sons at bedside, updated pt current condition, treatment plan and potential prognosis. They expressed understanding and appreciation.  I spent 35 minutes of neurocritical care time in the care of this patient.   Marvel PlanJindong Tiron Suski, MD PhD Stroke Neurology 01/18/2017 12:37 PM    To contact Stroke Continuity provider, please refer to [email or web address]. After hours, contact General Neurology

## 2017-01-18 NOTE — Progress Notes (Signed)
Subjective: No further reported bleeding.  Objective: Vital signs in last 24 hours: Temp:  [97.6 F (36.4 C)-99.1 F (37.3 C)] 99.1 F (37.3 C) (11/08 1206) Pulse Rate:  [54-113] 100 (11/08 1300) Resp:  [13-24] 24 (11/08 1500) BP: (99-155)/(45-99) 122/80 (11/08 1500) SpO2:  [97 %-100 %] 99 % (11/08 1300) FiO2 (%):  [40 %] 40 % (11/08 0751) Weight:  [151 lb 14.4 oz (68.9 kg)] 151 lb 14.4 oz (68.9 kg) (11/08 0411) Weight change: -1.7 oz (-1.408 kg) Last BM Date: (pta)  PE: GEN:  Awake, some expressive aphasia, NAD  Lab Results: CBC    Component Value Date/Time   WBC 19.5 (H) 01/18/2017 0337   RBC 4.18 01/18/2017 0337   HGB 12.6 01/18/2017 0337   HCT 36.4 01/18/2017 0337   PLT 238 01/18/2017 0337   MCV 87.1 01/18/2017 0337   MCH 30.1 01/18/2017 0337   MCHC 34.6 01/18/2017 0337   RDW 13.1 01/18/2017 0337   LYMPHSABS 2.3 01/18/2017 0337   MONOABS 1.6 (H) 01/18/2017 0337   EOSABS 0.0 01/18/2017 0337   BASOSABS 0.0 01/18/2017 0337   CMP     Component Value Date/Time   NA 139 01/18/2017 0337   K 3.4 (L) 01/18/2017 0337   CL 103 01/18/2017 0337   CO2 26 01/18/2017 0337   GLUCOSE 111 (H) 01/18/2017 0337   BUN 17 01/18/2017 0337   CREATININE 0.96 01/18/2017 0337   CALCIUM 9.4 01/18/2017 0337   PROT 6.2 (L) 01/17/2017 0347   ALBUMIN 3.1 (L) 01/17/2017 0347   AST 30 01/17/2017 0347   ALT 16 01/17/2017 0347   ALKPHOS 91 01/17/2017 0347   BILITOT 0.7 01/17/2017 0347   GFRNONAA 53 (L) 01/18/2017 0337   GFRAA >60 01/18/2017 0337   Assessment:  1.  Hematochezia (?), after thrombolytics for stroke, with additional complication of intracranial hemorrhage.  No further bleeding.  Plan:  1.  No further GI bleeding, likely side effect from thrombolytics.  Don't feel utility or safety of any further GI tract investigation (e.g., colonoscopy) at the present time. 2.  [facility] will sign-off; she can follow-up with [facility] ([phone]) if desired by patient, family, or  primary care physician.  Thank you for the consultation; please call with any further questions.   Freddy JakschOUTLAW,Mauria Asquith M 01/18/2017, 3:16 PM   Cell [phone] If no answer or after 5 PM call [phone]

## 2017-01-18 NOTE — Procedures (Signed)
Transported to CT and back on vent, no complications.

## 2017-01-18 NOTE — Progress Notes (Signed)
PT Cancellation Note  Patient Details Name: Waynard EdwardsWilma Fetting MRN: [id number] DOB: 06/21/1930   Cancelled Treatment:    Reason Eval/Treat Not Completed: Medical issues which prohibited therapy(pt just extubated and not yet ready for evaluation. Will plan to see next date)   Enedina FinnerMaija B Marnee Sherrard 01/18/2017, 10:21 AM  Delaney MeigsMaija Tabor Damani Kelemen, PT [phone]

## 2017-01-18 NOTE — Care Management Note (Signed)
Case Management Note  Patient Details  Name: Terri EdwardsWilma Torres MRN: [id number] Date of Birth: 02/17/1931  Subjective/Objective:  Pt admitted on 01/17/17 with acute Lt MCA infarcts; SAH noted on CT after TPA.  PTA, pt resided at home with daughter.                  Action/Plan: Pt extubated today; PT/OT consults pending.  Will follow for discharge planning as pt progresses.   Expected Discharge Date:                  Expected Discharge Plan:     In-House Referral:     Discharge planning Services  CM Consult  Post Acute Care Choice:    Choice offered to:     DME Arranged:    DME Agency:     HH Arranged:    HH Agency:     Status of Service:  In process, will continue to follow  If discussed at Long Length of Stay Meetings, dates discussed:    Additional Comments:  Quintella BatonJulie W. Burch [place]uk, RN, BSN  Trauma/Neuro ICU Case Manager [phone]

## 2017-01-18 NOTE — Progress Notes (Signed)
[facility]PA bolus dose was initiated at 0035 on 11/7.

## 2017-01-18 NOTE — Progress Notes (Signed)
PULMONARY / CRITICAL CARE MEDICINE   Name: Terri Torres MRN: [id number] DOB: 1930-09-22    ADMISSION DATE:      CHIEF COMPLAINT:  Garbled speech  HISTORY OF PRESENT ILLNESS:   This is an 83 year old with a history of chronic atrial fibrillation not on anticoagulation and a prior history of TIA who suffered from garbled speech on the night of 11/6.Terri Torres  She was seen at an outside hospital where she was felt to have an M2 occlusion and was given TPA.  On arrival here she was acutely short of breath and required intubation and mechanical ventilation she was given 1 dose of Lasix and her PO2 following that was 444.  A repeat CT scan showed both subarachnoid and subdural blood subsequently her TPA was reversed with 1 dose of tranexamic acid. Repeat CT early this am shows a left basal ganglia infarct and a small amount of right frontal subarachnoid blood. She remains intubated this morning but is nodding appropriately and following instructions for me this morning  PAST MEDICAL HISTORY :  She  has a past medical history of A-fib (HCC) and TIA (transient ischemic attack) (2015).  PAST SURGICAL HISTORY: She  has a past surgical history that includes Femur fracture surgery and Wrist fracture surgery.  Allergies  Allergen Reactions  . Macrobid [Nitrofurantoin Macrocrystal] Shortness Of Breath    No current facility-administered medications on file prior to encounter.    Current Outpatient Medications on File Prior to Encounter  Medication Sig  . aspirin 81 MG chewable tablet Chew 81 mg daily by mouth.  . carvedilol (COREG) 3.125 MG tablet Take 3.125 mg 2 (two) times daily by mouth.  . furosemide (LASIX) 20 MG tablet Take 20 mg 2 (two) times a week by mouth.   . Multiple Vitamins-Minerals (MULTIVITAMIN ADULT) CHEW Chew 2 tablets daily by mouth.  Terri Torres omeprazole (PRILOSEC) 20 MG capsule Take 20 mg daily by mouth.    FAMILY HISTORY:  Her has no family status information on file.    SOCIAL  HISTORY: She  reports that she has been smoking cigarettes.  She has been smoking about 0.50 packs per day. she has never used smokeless tobacco. She reports that she drinks about 0.6 oz of alcohol per week. She reports that she does not use drugs.  VITAL SIGNS: BP (!) 147/76 Comment: sedation restarted-MD called for PRN  Pulse (!) 105   Temp 97.6 F (36.4 C) (Axillary)   Resp 17   Ht 5\' 3"  (1.6 m)   Wt 151 lb 14.4 oz (68.9 kg)   SpO2 97%   BMI 26.91 kg/m   HEMODYNAMICS:    VENTILATOR SETTINGS: Vent Mode: PRVC FiO2 (%):  [40 %] 40 % Set Rate:  [18 bmp] 18 bmp Vt Set:  [450 mL-520 mL] 450 mL PEEP:  [5 cmH20] 5 cmH20 Plateau Pressure:  [17 cmH20-18 cmH20] 18 cmH20  INTAKE / OUTPUT: I/O last 3 completed shifts: In: 930.3 [I.V.:830.3; IV Piggyback:100] Out: 4160 [Urine:2600; Emesis/NG output:1560]  PHYSICAL EXAMINATION: General:  Thin to cachectic elderly white female, orally intubated and in no distress Neuro:  She has spontaneous eye opening and nods to questions for me. Pupils equal, face symmetric, she moves all 4's on request. Cardiovascular: no JVD, S1 and S2 irreg irreg, no m g or rub no dependent edema feet pink Lungs: unlabored, symmetric air movement, no wheezes, few scattered rhonchi Abdomen:  Scafoid, soft, no organomegaly, no tenderness, no masses   LABS:  BMET Recent Labs  Lab  01/17/17 0347 01/17/17 0410 01/18/17 0337  NA 138 140 139  K 4.1 4.1 3.4*  CL 106 106 103  CO2 23  --  26  BUN 13 15 17   CREATININE 0.88 0.80 0.96  GLUCOSE 183* 182* 111*    Electrolytes Recent Labs  Lab 01/17/17 0347 01/18/17 0337  CALCIUM 8.9 9.4  MG 1.7 1.6*  PHOS 4.8* 3.2    CBC Recent Labs  Lab 01/17/17 0347 01/17/17 0410 01/18/17 0337  WBC 8.6  --  19.5*  HGB 12.1 12.2 12.6  HCT 39.1 36.0 36.4  PLT 221  --  238    Coag's Recent Labs  Lab 01/17/17 0347 01/17/17 1159  APTT 27  --   INR 1.08 1.03    Sepsis Markers No results for input(s):  LATICACIDVEN, PROCALCITON, O2SATVEN in the last 168 hours.  ABG Recent Labs  Lab 01/17/17 0350 01/18/17 0350  PHART 7.332* 7.614*  PCO2ART 54.3* 26.1*  PO2ART 444.0* 122*    Liver Enzymes Recent Labs  Lab 01/17/17 0347  AST 30  ALT 16  ALKPHOS 91  BILITOT 0.7  ALBUMIN 3.1*    Cardiac Enzymes No results for input(s): TROPONINI, PROBNP in the last 168 hours.  Glucose Recent Labs  Lab 01/17/17 0234 01/17/17 0355 01/17/17 1249 01/17/17 1533 01/17/17 2001 01/17/17 2350  GLUCAP 144* 179* 148* 113* 116* 113*    Imaging Ct Head Wo Contrast  Result Date: 01/18/2017 CLINICAL DATA:  Stroke follow-up EXAM: CT HEAD WITHOUT CONTRAST TECHNIQUE: Contiguous axial images were obtained from the base of the skull through the vertex without intravenous contrast. COMPARISON:  Brain MRI 01/17/2017 FINDINGS: Brain: Unchanged right frontal subarachnoid blood. No new hemorrhage. Hypoattenuation in the left caudate head consistent with known infarct. There is periventricular hypoattenuation compatible with chronic microvascular disease. No midline shift or hydrocephalus. Brain parenchyma and CSF-containing spaces are normal for age. Vascular: No hyperdense vessel or unexpected calcification. Skull: Normal visualized skull base, calvarium and extracranial soft tissues. Sinuses/Orbits: Mild right maxillary mucosal thickening. Partial opacification of the left frontal sinus. Normal orbits. IMPRESSION: 1. Unchanged appearance of right frontal lobe subarachnoid hemorrhage. No new hemorrhage identified. 2. Hypoattenuation in the left caudate head consistent with known infarct. No midline shift or hydrocephalus. Electronically Signed   By: Deatra RobinsonKevin  Herman M.D.   On: 01/18/2017 01:16   Mr Maxine GlennMra Head Wo Contrast  Result Date: 01/17/2017 CLINICAL DATA:  Stroke. Acute onset dysphagia and right-sided weakness. IV tPA administered with subsequent subarachnoid hemorrhage. EXAM: MRI HEAD WITHOUT CONTRAST MRA HEAD  WITHOUT CONTRAST TECHNIQUE: Multiplanar, multiecho pulse sequences of the brain and surrounding structures were obtained without intravenous contrast. Angiographic images of the head were obtained using MRA technique without contrast. COMPARISON:  Head CT and CTA 01/17/2017 FINDINGS: MRI HEAD FINDINGS Brain: There are small acute left MCA territory infarcts involving the caudate nucleus, posterior insula, and temporal lobe along the sylvian fissure. Abnormal diffusion signal along multiple sulci/ gyri in the right cerebral hemisphere, predominantly in the frontal operculum region, is attributed predominantly to subarachnoid hemorrhage, however a small amount of cortical and subcortical infarction is also suspected in the anterior operculum region. There may be a subcentimeter subacute infarct along the anterior right insula. Subarachnoid hemorrhage in multiple right frontal sulci is similar in distribution to the CT earlier today. There is also small volume subarachnoid hemorrhage in the right temporoparietal region and sylvian fissure. There may be minimal extra-axial blood along the posterior falx as described on CT. There is moderate cerebral  atrophy. No mass or midline shift is seen. Patchy T2 hyperintensities in the cerebral white matter are nonspecific but compatible with moderate chronic small vessel ischemic disease. Milder chronic small vessel changes are present in the pons. There is a small chronic left cerebellar infarct. Vascular: Major intracranial vascular flow voids are preserved. Skull and upper cervical spine: Unremarkable bone marrow signal. Sinuses/Orbits: Bilateral cataract extraction. Mild mucosal thickening in the paranasal sinuses. Small volume pharyngeal fluid in the setting of intubation. Clear mastoid air cells. Other: None. MRA HEAD FINDINGS The visualized distal vertebral arteries are patent with the left being dominant. Patent AICA origins are identified bilaterally. SCAs are not well  seen. The basilar artery is widely patent. Posterior communicating arteries are diminutive or absent. PCAs are patent without evidence of significant proximal stenosis. There is diminished signal in both P2 segments due to artifact at the slab interface. The internal carotid arteries are widely patent from skullbase to carotid termini. The MCAs are patent with artifact through the left MCA bifurcation and bilateral proximal M2 segments, limiting assessment. There has been partial interval recanalization of the mid left M2 occlusion on the prior CTA, with a branch coursing superiorly in the sylvian fissure now being patent, although a inferiorly directed branch remains either occluded or with diminished flow. The ACAs are patent without evidence of significant proximal stenosis. No aneurysm is identified. IMPRESSION: 1. Small acute left MCA infarcts. 2. Right-sided subarachnoid hemorrhage as seen on CT. Suspected small amount of acute cortical/subcortical infarction in the anterior right frontal operculum region, though much of the abnormal diffusion signal in the right hemisphere is attributed to hemorrhage. 3. Moderate chronic small vessel ischemic disease and cerebral atrophy. 4. No large vessel occlusion. Partial recanalization of previously described left M2 branch vessel occlusion. Electronically Signed   By: Sebastian Ache M.D.   On: 01/17/2017 11:42   Mr Brain Wo Contrast  Result Date: 01/17/2017 CLINICAL DATA:  Stroke. Acute onset dysphagia and right-sided weakness. IV tPA administered with subsequent subarachnoid hemorrhage. EXAM: MRI HEAD WITHOUT CONTRAST MRA HEAD WITHOUT CONTRAST TECHNIQUE: Multiplanar, multiecho pulse sequences of the brain and surrounding structures were obtained without intravenous contrast. Angiographic images of the head were obtained using MRA technique without contrast. COMPARISON:  Head CT and CTA 01/17/2017 FINDINGS: MRI HEAD FINDINGS Brain: There are small acute left MCA  territory infarcts involving the caudate nucleus, posterior insula, and temporal lobe along the sylvian fissure. Abnormal diffusion signal along multiple sulci/ gyri in the right cerebral hemisphere, predominantly in the frontal operculum region, is attributed predominantly to subarachnoid hemorrhage, however a small amount of cortical and subcortical infarction is also suspected in the anterior operculum region. There may be a subcentimeter subacute infarct along the anterior right insula. Subarachnoid hemorrhage in multiple right frontal sulci is similar in distribution to the CT earlier today. There is also small volume subarachnoid hemorrhage in the right temporoparietal region and sylvian fissure. There may be minimal extra-axial blood along the posterior falx as described on CT. There is moderate cerebral atrophy. No mass or midline shift is seen. Patchy T2 hyperintensities in the cerebral white matter are nonspecific but compatible with moderate chronic small vessel ischemic disease. Milder chronic small vessel changes are present in the pons. There is a small chronic left cerebellar infarct. Vascular: Major intracranial vascular flow voids are preserved. Skull and upper cervical spine: Unremarkable bone marrow signal. Sinuses/Orbits: Bilateral cataract extraction. Mild mucosal thickening in the paranasal sinuses. Small volume pharyngeal fluid in the setting of  intubation. Clear mastoid air cells. Other: None. MRA HEAD FINDINGS The visualized distal vertebral arteries are patent with the left being dominant. Patent AICA origins are identified bilaterally. SCAs are not well seen. The basilar artery is widely patent. Posterior communicating arteries are diminutive or absent. PCAs are patent without evidence of significant proximal stenosis. There is diminished signal in both P2 segments due to artifact at the slab interface. The internal carotid arteries are widely patent from skullbase to carotid termini. The  MCAs are patent with artifact through the left MCA bifurcation and bilateral proximal M2 segments, limiting assessment. There has been partial interval recanalization of the mid left M2 occlusion on the prior CTA, with a branch coursing superiorly in the sylvian fissure now being patent, although a inferiorly directed branch remains either occluded or with diminished flow. The ACAs are patent without evidence of significant proximal stenosis. No aneurysm is identified. IMPRESSION: 1. Small acute left MCA infarcts. 2. Right-sided subarachnoid hemorrhage as seen on CT. Suspected small amount of acute cortical/subcortical infarction in the anterior right frontal operculum region, though much of the abnormal diffusion signal in the right hemisphere is attributed to hemorrhage. 3. Moderate chronic small vessel ischemic disease and cerebral atrophy. 4. No large vessel occlusion. Partial recanalization of previously described left M2 branch vessel occlusion. Electronically Signed   By: Sebastian Ache M.D.   On: 01/17/2017 11:42   Dg Chest Port 1 View  Result Date: 01/18/2017 CLINICAL DATA:  ETT/HX STROKE,CHF, atrial fibrillation not on anticoagulation , hypertension EXAM: PORTABLE CHEST 1 VIEW COMPARISON:  01/17/2017 FINDINGS: Endotracheal tube tip 3.5 cm above the carina. Nasogastric tube enters the stomach. Reverse lordotic projection is mildly rotated to the right. Atherosclerotic calcification of the aortic arch. Mild enlargement of the cardiopericardial silhouette without overt edema. Accentuated interstitium but the asymmetric accentuation in the left lung is improved. Thoracic spondylosis. Bony demineralization. Stable right rib deformities. IMPRESSION: 1. Improvement in the hazy interstitial accentuation in the left lung. Mild interstitial accentuation persists bilaterally. 2. Tubes and lines appear satisfactorily position. 3.  Aortic Atherosclerosis (ICD10-I70.0). 4. Mild cardiomegaly. Electronically Signed    By: Gaylyn Rong M.D.   On: 01/18/2017 07:49       ANTIBIOTICS: none  SIGNIFICANT EVENTS: Intubated 11/7  DISCUSSION: This is a 83 year old with chronic atrial fibrillation and a history of a prior TIA who presented to an outside hospital with garbled speech.  There a study suggested an M2 occlusion and she was given tpa.  On arrival here she developed respiratory distress and was intubated.  She was given a single dose of Lasix.  CT scan of the head here shows some subarachnoid and possibly small subdural hematoma consequently she was given 1 dose of tranexamic acid. This morning she is alert and interactive  ASSESSMENT / PLAN:  PULMONARY A: Gas exchange and mechanics look good this morning. I am anticipating extubation this am. I am introducing meds for CHF this am  CARDIOVASCULAR A: Chronic atrial fibrillation, and very poor LV systolic function with Ef 25 to 30% on echo this admission. Resuming low dose coreg, and adding HCTZ as a diuretic. If BP allows will subsequently add low dose ACE inhibitor. Anticoagulation per neurology    GASTROINTESTINAL A: prophylaxis with Proton   INFECTIOUS A:  No overt active infection at present     ENDOCRINE A: single elevated glucose. A1C is normal     NEUROLOGIC A: M2 CVA S/P tpa with subsequent SAH and perhaps small SDH. Very alert  this am. Adding statin, anticoagulant per neurology, will gently introduce afterload reduction for CHF, but at this point I fear hypotension more than a slight overshot to the high side.   FAMILY   Greater than 32 minutes of patient care   Penny PiaWJ Gray, MD Critical Care Medicine [facility] Pager: [phone]  01/18/2017, 8:31 AM

## 2017-01-18 NOTE — Evaluation (Signed)
Clinical/Bedside Swallow Evaluation Patient Details  Name: Terri EdwardsWilma Torres MRN: [id number] Date of Birth: 02/21/1931  Today's Date: 01/18/2017 Time: SLP Start Time (ACUTE ONLY): 1550 SLP Stop Time (ACUTE ONLY): 1604 SLP Time Calculation (min) (ACUTE ONLY): 14 min  Past Medical History:  Past Medical History:  Diagnosis Date  . A-fib (HCC)   . TIA (transient ischemic attack) 2015   Past Surgical History:  Past Surgical History:  Procedure Laterality Date  . FEMUR FRACTURE SURGERY    . WRIST FRACTURE SURGERY     HPI:  Terri Torres a 83 y.o.femalewith history of A. fib not on anticoagulation, CHF on aspirin, dementia admitted for slurred speech, right-sided weakness. 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 Also with SAH, right frontal likely related to TPA use, reversed with tranexamic acid. Also with GI bleed.    Assessment / Plan / Recommendation Clinical Impression  Bedside swallow evaluation complete. Although patient presenting with only subtle indication of decreased airway protection characterized by throat clearing post swallow, cannot r/o intubation and/or neuro related dysfunction with sensory component. In light of only recent extubation, recommend continued NPO status except ice chips PRN after oral care with diagnostic treatment in am 11/9 to determine potential to resume pos.  SLP Visit Diagnosis: Dysphagia, unspecified (R13.10)       Diet Recommendation NPO;Ice chips PRN after oral care   Medication Administration: Via alternative means    Other  Recommendations Oral Care Recommendations: Oral care QID   Follow up Recommendations (TBD)      Frequency and Duration min 2x/week  2 weeks       Prognosis Prognosis for Safe Diet Advancement: Good Barriers to Reach Goals: Cognitive deficits      Swallow Study   General HPI: Terri Torres a 83 y.o.femalewith history of A. fib not on anticoagulation, CHF on aspirin, dementia admitted for  slurred speech, right-sided weakness. 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 Also with SAH, right frontal likely related to TPA use, reversed with tranexamic acid. Also with GI bleed.  Type of Study: Bedside Swallow Evaluation Previous Swallow Assessment: none Diet Prior to this Study: NPO Temperature Spikes Noted: No Respiratory Status: Room air History of Recent Intubation: Yes Length of Intubations (days): 1 days Date extubated: 01/18/17 Behavior/Cognition: Alert;Cooperative;Pleasant mood;Confused Oral Cavity Assessment: Within Functional Limits Oral Care Completed by SLP: Recent completion by staff Oral Cavity - Dentition: Adequate natural dentition Vision: Functional for self-feeding Self-Feeding Abilities: Able to feed self Patient Positioning: Upright in bed Baseline Vocal Quality: Low vocal intensity Volitional Cough: Strong Volitional Swallow: Able to elicit    Oral/Motor/Sensory Function Overall Oral Motor/Sensory Function: Within functional limits   Ice Chips Ice chips: Impaired Presentation: Spoon Pharyngeal Phase Impairments: Throat Clearing - Immediate;Throat Clearing - Delayed   Thin Liquid Thin Liquid: Impaired Presentation: Cup;Self Fed Pharyngeal  Phase Impairments: Throat Clearing - Immediate    Nectar Thick Nectar Thick Liquid: Not tested   Honey Thick Honey Thick Liquid: Not tested   Puree Puree: Impaired Presentation: Spoon Pharyngeal Phase Impairments: Throat Clearing - Immediate   Solid   Terri Saraceni MA, CCC-SLP [phone]    Solid: Not tested        Terri Torres Terri Torres 01/18/2017,4:06 PM

## 2017-01-18 NOTE — Progress Notes (Signed)
OT Cancellation Note  Patient Details Name: Waynard EdwardsWilma Car MRN: [id number] DOB: 06/07/1930   Cancelled Treatment:    Reason Eval/Treat Not Completed: Other (comment). Discussed with nsg. Trying to extubate this am. May attempt this pm or tomorrow if appropriate.   [facility] England[facility],HILLARY  Mekai Wilkinson, OT/L  [phone] 01/18/2017 01/18/2017, 7:54 AM

## 2017-01-18 NOTE — Procedures (Signed)
Extubation Procedure Note  Patient Details:   Name: Terri EdwardsWilma Torres DOB: 02/21/1931 MRN: [id number]   Airway Documentation:     Evaluation  O2 sats: stable throughout Complications: No apparent complications Patient did tolerate procedure well. Bilateral Breath Sounds: Clear   Yes  Dois Juarbe V 01/18/2017, 10:09 AM

## 2017-01-18 NOTE — Progress Notes (Signed)
eLink Physician-Brief Progress Note Patient Name: Waynard EdwardsWilma Lubbers DOB: 02/16/1931 MRN: [id number]   Date of Service  01/18/2017  HPI/Events of Note  Resp alkalosis with pH greater than 7.6 and pCO2 less than 30  eICU Interventions  Reduce TV to 450 cc Recheck ABG in 2 - 3 hours     Intervention Category Major Interventions: Acid-Base disturbance - evaluation and management  Dwayne Bulkley 01/18/2017, 4:32 AM

## 2017-01-19 DIAGNOSIS — K625 Hemorrhage of anus and rectum: Secondary | ICD-10-CM

## 2017-01-19 DIAGNOSIS — I482 Chronic atrial fibrillation, unspecified: Secondary | ICD-10-CM

## 2017-01-19 LAB — GLUCOSE, CAPILLARY
GLUCOSE-CAPILLARY: 109 mg/dL — AB (ref 65–99)
GLUCOSE-CAPILLARY: 112 mg/dL — AB (ref 65–99)
Glucose-Capillary: 106 mg/dL — ABNORMAL HIGH (ref 65–99)
Glucose-Capillary: 99 mg/dL (ref 65–99)

## 2017-01-19 LAB — BASIC METABOLIC PANEL
ANION GAP: 8 (ref 5–15)
BUN: 15 mg/dL (ref 6–20)
CALCIUM: 9.3 mg/dL (ref 8.9–10.3)
CO2: 27 mmol/L (ref 22–32)
CREATININE: 0.82 mg/dL (ref 0.44–1.00)
Chloride: 103 mmol/L (ref 101–111)
GLUCOSE: 108 mg/dL — AB (ref 65–99)
Potassium: 3.7 mmol/L (ref 3.5–5.1)
Sodium: 138 mmol/L (ref 135–145)

## 2017-01-19 LAB — MAGNESIUM: Magnesium: 1.8 mg/dL (ref 1.7–2.4)

## 2017-01-19 MED ORDER — PANTOPRAZOLE SODIUM 40 MG PO TBEC
40.0000 mg | DELAYED_RELEASE_TABLET | Freq: Every day | ORAL | Status: DC
  Filled 2017-01-19: qty 1

## 2017-01-19 MED ORDER — POLYETHYLENE GLYCOL 3350 17 G PO PACK
17.0000 g | PACK | Freq: Every day | ORAL | Status: DC
  Administered 2017-01-19 – 2017-01-20 (×2): 17 g via ORAL
  Filled 2017-01-19: qty 1

## 2017-01-19 MED ORDER — SENNOSIDES-DOCUSATE SODIUM 8.6-50 MG PO TABS
1.0000 | ORAL_TABLET | Freq: Two times a day (BID) | ORAL | Status: DC
  Administered 2017-01-20: 1 via ORAL
  Filled 2017-01-19 (×2): qty 1

## 2017-01-19 NOTE — Evaluation (Signed)
Physical Therapy Evaluation Patient Details Name: Terri Torres MRN: [id number] DOB: 12/11/1930 Today's Date: 01/19/2017   History of Present Illness  [name] is a 83 y.o. female admitted for slurred speech, right-sided weakness. 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 Also with SAH, right frontal likely related to TPA use, reversed with tranexamic acid. Also with GI bleed.  Pt intubated 11/7-11/8. Prior history of A. fib not on anticoagulation, CHF on aspirin, dementia, prior L hip fx fixed with screws.      Clinical Impression  Pt admitted with above diagnosis. Pt currently with functional limitations due to the deficits listed below (see PT Problem List). PTA pt was ambulating in house with RW and required assist with iADLs and some ADLs. Pt currently minA for bed mobility, transfers and ambulation of 20 feet with RW. Pt major limitation is generalized weakness and PT recommends HHPT at discharge Pt will benefit from skilled PT in the acute setting to increase their independence and safety with mobility to allow discharge to the venue listed below.       Follow Up Recommendations Home health PT;Supervision/Assistance - 24 hour    Equipment Recommendations  None recommended by PT    Recommendations for Other Services       Precautions / Restrictions Precautions Precautions: Fall Restrictions Weight Bearing Restrictions: No      Mobility  Bed Mobility Overal bed mobility: Needs Assistance Bed Mobility: Supine to Sit     Supine to sit: Min assist     General bed mobility comments: minA for mangement of LE off bed and pad scoot of hips to EoB, difficulty with initiating task but one started pt participatory  Transfers Overall transfer level: Needs assistance Equipment used: Rolling walker (2 wheeled) Transfers: Sit to/from Stand Sit to Stand: Min assist         General transfer comment: minA for steadying in standing, vc for hand placement on  recliner to push off and reach back for armrest caused pt confusion however when left to her own devices exhibit good placement for power up and lowering down  Ambulation/Gait Ambulation/Gait assistance: Min assist;+2 physical assistance(close chair follow) Ambulation Distance (Feet): 15 Feet Assistive device: Rolling walker (2 wheeled) Gait Pattern/deviations: Step-through pattern;Decreased step length - right;Decreased stance time - left;Decreased weight shift to left;Trunk flexed Gait velocity: s;owed Gait velocity interpretation: Below normal speed for age/gender General Gait Details: steady gait, with good use of RW, minA for steadying, no overt LoB    Modified Rankin (Stroke Patients Only) Modified Rankin (Stroke Patients Only) Pre-Morbid Rankin Score: Moderately severe disability Modified Rankin: Moderately severe disability     Balance Overall balance assessment: Needs assistance Sitting-balance support: Feet supported;No upper extremity supported Sitting balance-Leahy Scale: Good     Standing balance support: No upper extremity supported;During functional activity Standing balance-Leahy Scale: Fair Standing balance comment: pt stopped during ambulation and was able to take both hands off RW to blow nose                             Pertinent Vitals/Pain Pain Assessment: Faces Faces Pain Scale: Hurts a little bit Pain Location: generalized with movement Pain Descriptors / Indicators: Grimacing Pain Intervention(s): Monitored during session;Repositioned    Home Living Family/patient expects to be discharged to:: Private residence Living Arrangements: Children Available Help at Discharge: Family;Available 24 hours/day Type of Home: House Home Access: Stairs to enter   Enter[employer] CorporationEntrance Stairs-Number of  Steps: 3 Home Layout: One level Home Equipment: Walker - 2 wheels;Bedside commode      Prior Function Level of Independence: Needs assistance   Gait /  Transfers Assistance Needed: ambulates household distances with Rw  ADL's / Homemaking Assistance Needed: pt able to dress herself if clothes laid out for her, needs assist with iADLs           Extremity/Trunk Assessment   Upper Extremity Assessment Upper Extremity Assessment: Defer to OT evaluation    Lower Extremity Assessment Lower Extremity Assessment: Generalized weakness;Difficult to assess due to impaired cognition       Communication   Communication: No difficulties  Cognition Arousal/Alertness: Awake/alert Behavior During Therapy: WFL for tasks assessed/performed Overall Cognitive Status: History of cognitive impairments - at baseline Area of Impairment: Orientation;Attention;Memory;Following commands;Safety/judgement;Awareness;Problem solving                 Orientation Level: Disoriented to;Place;Time;Situation(only can recall first name) Current Attention Level: Alternating Memory: Decreased short-term memory Following Commands: Follows multi-step commands inconsistently Safety/Judgement: Decreased awareness of safety Awareness: Emergent Problem Solving: Requires verbal cues;Difficulty sequencing;Slow processing General Comments: pt has difficulty follow commands unless they are functional to her at the time      General Comments General comments (skin integrity, edema, etc.): Daughter present throughout session         Assessment/Plan    PT Assessment Patient needs continued PT services  PT Problem List Decreased strength;Decreased range of motion;Decreased activity tolerance;Decreased mobility;Decreased coordination;Decreased cognition;Decreased safety awareness       PT Treatment Interventions DME instruction;Gait training;Stair training;Functional mobility training;Therapeutic activities;Therapeutic exercise;Balance training;Cognitive remediation;Patient/family education    PT Goals (Current goals can be found in the Care Plan section)  Acute  Rehab PT Goals Patient Stated Goal: none stated PT Goal Formulation: With patient/family Potential to Achieve Goals: Fair    Frequency Min 4X/week        Co-evaluation PT/OT/SLP Co-Evaluation/Treatment: Yes Reason for Co-Treatment: Necessary to address cognition/behavior during functional activity;To address functional/ADL transfers PT goals addressed during session: Mobility/safety with mobility;Balance;Proper use of DME OT goals addressed during session: ADL's and self-care;Proper use of Adaptive equipment and DME       AM-PAC PT "6 Clicks" Daily Activity  Outcome Measure Difficulty turning over in bed (including adjusting bedclothes, sheets and blankets)?: Unable Difficulty moving from lying on back to sitting on the side of the bed? : Unable Difficulty sitting down on and standing up from a chair with arms (e.g., wheelchair, bedside commode, etc,.)?: Unable Help needed moving to and from a bed to chair (including a wheelchair)?: A Little Help needed walking in hospital room?: A Little Help needed climbing 3-5 steps with a railing? : A Lot 6 Click Score: 11    End of Session Equipment Utilized During Treatment: Gait belt Activity Tolerance: Patient tolerated treatment well Patient left: in chair;with call bell/phone within reach;with chair alarm set;with family/visitor present Nurse Communication: Mobility status PT Visit Diagnosis: Unsteadiness on feet (R26.81);Other abnormalities of gait and mobility (R26.89);Muscle weakness (generalized) (M62.81);Difficulty in walking, not elsewhere classified (R26.2);Other symptoms and signs involving the nervous system (Z61.096)    Time: 0454-0981 PT Time Calculation (min) (ACUTE ONLY): 41 min   Charges:   PT Evaluation $PT Eval Moderate Complexity: 1 Mod PT Treatments $Gait Training: 8-22 mins   PT G Codes:        Phill Steck B. Beverely Risen PT, DPT Acute Rehabilitation  [phone] Pager [phone]    Elon Alas  Fleet  01/19/2017, 12:44 PM

## 2017-01-19 NOTE — Progress Notes (Signed)
[facility] Progress Note  Patient: Terri Torres ZOX:[id number]   PCP: Hurshel Party, NP DOB: 08/09/1930   DOA: 01/17/2017   DOS: 01/19/2017   Date of Service: the patient was seen and examined on 01/19/2017  Subjective: Patient denies any acute complaint.  No nausea no vomiting.  Not maintaining eye contact.  No acute events identified.  No further bleeding episode identified.  No bowel movement though.  Brief hospital course: Pt. with PMH of A. fib and TIA; admitted on 01/17/2017, presented with complaint of difficulty speaking and right-sided weakness, was found to have acute CVA.  She initially presented at [facility] ED on 11 6.  Telemetry neurologist was consulted at [facility] ER and decision to administer TPA was taken given the patient was in window period.  Patient was subsequently transferred to [facility] ER for left M2 occlusion for emergent perfusion study.  On arrival patient was more confused than her initial presentation at [facility] ER and also had rectal and vaginal bleeding.  Repeat CT scan was performed which showed SAH bleeding.  Patient was given tranexamic acid to reverse TPA.  Intubated for airway protection in the ER and transferred to ICU.  Extubated on 01/18/2017 and transferred to floor. Currently further plan is monitor neurological recovery.  Assessment and Plan: 1.  Acute CVA Left basal ganglia and insular cortex small infarct. Embolic secondary to A. fib not being on anticoagulation. Has mild aphasia as well as dysarthria.  Swallow evaluation has cleared the patient. PT OT recommends home with home health. Echocardiogram shows EF of 25-30%. Patient did have M2 occlusion on initial CT scan performed on outside hospital, here MRI shows partial recanalization of left M2. Per neurology patient will need anticoagulation for stroke prevention due to her A. fib in future and they would recommend Eliquis to start once SAH resolves.  2.  Rectal bleed. Most likely S/P  TPA. Currently resolved after reversal of the TPA. [facility] was consulted recommend no further workup no inpatient procedures since hemoglobin has stable and no further bleeding after reversal. Outpatient follow-up as well as close monitoring while the patient will be on anticoagulation.  3.  Chronic A. fib not on anticoagulation due to recurrent fall. Now the patient is presenting with a stroke family is agreeable to start the patient on Eliquis. Neurology recommends to start this medication after a repeat CT scan of the head in 10 days. Neurology recommends 2.5 mg twice daily.  4.  Systolic cardiomyopathy. Ejection fraction 25-30%, diffuse hypokinesis. We will consult cardiology for further workup.  Not a candidate for intervention or procedure due to [facility] as well as GI bleed for now. Most likely will require outpatient follow-up as well as titration of the medication right now. Patient currently on carvedilol 6.25 mg twice daily. Also adding losartan. Holding HCTZ right now.  5.  Essential hypertension. Blood pressure stable. Monitor. Patient on HCTZ and Coreg at home. Changing Coreg and losartan for now.  6.  Dementia. Does have dementia at her baseline which may be contributing to her current speech issues as well. Monitor. No behavioral issues right now.  Diet: Regular diet DVT Prophylaxis: mechanical compression device.  Advance goals of care discussion: DNR DNI  Family Communication: family was present at bedside, at the time of interview. The pt provided permission to discuss medical plan with the family. Opportunity was given to ask question and all questions were answered satisfactorily.   Disposition:  Discharge to home.  Consultants: neurology, cardiology Procedures:  Echocardiogram  Antibiotics: Anti-infectives (From admission, onward)   None       Objective: Physical Exam: Vitals:   01/19/17 0046 01/19/17 0407 01/19/17 1005 01/19/17 1521  BP: 117/85  130/76 138/67 129/70  Pulse: (!) 114 (!) 104 (!) 109 (!) 104  Resp: 20 20 19 20   Temp: 98.5 F (36.9 C) 99 F (37.2 C) 98.6 F (37 C) 98 F (36.7 C)  TempSrc: Oral Oral Oral Oral  SpO2: 95% 97% 98% 97%  Weight:      Height:        Intake/Output Summary (Last 24 hours) at 01/19/2017 1619 Last data filed at 01/19/2017 0700 Gross per 24 hour  Intake 160 ml  Output 1050 ml  Net -890 ml   Filed Weights   01/17/17 0630 01/18/17 0411 01/18/17 2150  Weight: 71.2 kg (156 lb 15.5 oz) 68.9 kg (151 lb 14.4 oz) 74.2 kg (163 lb 8 oz)   General: Alert, Awake and Oriented to Time, Place and Person. Appear in mild distress, affect appropriate Eyes: PERRL, Conjunctiva normal ENT: Oral Mucosa clear moist. Neck: no JVD, no Abnormal Mass Or lumps Cardiovascular: S1 and S2 Present, no Murmur, Peripheral Pulses Present Respiratory: normal respiratory effort, Bilateral Air entry equal and Decreased, no use of accessory muscle, Clear to Auscultation, no Crackles, no wheezes Abdomen: Bowel Sound present, Soft and no tenderness, no hernia Skin: no redness, no Rash, no induration Extremities: no Pedal edema, no calf tenderness Neurologic: Grossly no focal neuro deficit. Bilaterally Equal motor strength  Data Reviewed: CBC: Recent Labs  Lab 01/17/17 0347 01/17/17 0410 01/18/17 0337  WBC 8.6  --  19.5*  NEUTROABS 6.3  --  15.6*  HGB 12.1 12.2 12.6  HCT 39.1 36.0 36.4  MCV 91.8  --  87.1  PLT 221  --  238   Basic Metabolic Panel: Recent Labs  Lab 01/17/17 0347 01/17/17 0410 01/18/17 0337 01/19/17 0351  NA 138 140 139 138  K 4.1 4.1 3.4* 3.7  CL 106 106 103 103  CO2 23  --  26 27  GLUCOSE 183* 182* 111* 108*  BUN 13 15 17 15   CREATININE 0.88 0.80 0.96 0.82  CALCIUM 8.9  --  9.4 9.3  MG 1.7  --  1.6* 1.8  PHOS 4.8*  --  3.2  --     Liver Function Tests: Recent Labs  Lab 01/17/17 0347  AST 30  ALT 16  ALKPHOS 91  BILITOT 0.7  PROT 6.2*  ALBUMIN 3.1*   No results for  input(s): LIPASE, AMYLASE in the last 168 hours. No results for input(s): AMMONIA in the last 168 hours. Coagulation Profile: Recent Labs  Lab 01/17/17 0347 01/17/17 1159  INR 1.08 1.03   Cardiac Enzymes: No results for input(s): CKTOTAL, CKMB, CKMBINDEX, TROPONINI in the last 168 hours. BNP (last 3 results) No results for input(s): PROBNP in the last 8760 hours. CBG: Recent Labs  Lab 01/17/17 2001 01/17/17 2350 01/18/17 1149 01/19/17 0613 01/19/17 1150  GLUCAP 116* 113* 109* 109* 112*   Studies: No results found.  Scheduled Meds: . atorvastatin  20 mg Oral q1800  . carvedilol  6.25 mg Oral BID WC  . heparin subcutaneous  5,000 Units Subcutaneous Q12H  . hydrochlorothiazide  25 mg Oral Daily  . insulin aspart  0-9 Units Subcutaneous TID WC  . mouth rinse  15 mL Mouth Rinse BID  . pantoprazole  40 mg Oral QHS   Continuous Infusions: . sodium chloride 40 mL/hr  at 01/19/17 0201  . albuterol    . albuterol     PRN Meds: albuterol, labetalol  Time spent: 35 minutes  Author: Lynden OxfordPranav Randale Carvalho, MD [facility] Pager: [phone] 01/19/2017 4:19 PM  If 7PM-7AM, please contact night-coverage at [email or web address], password [email or web address]

## 2017-01-19 NOTE — Plan of Care (Signed)
Patient stable and doing well tonight.Able to follow some commands when instructed

## 2017-01-19 NOTE — Progress Notes (Signed)
  Speech Language Pathology Treatment: Dysphagia  Patient Details Name: Terri EdwardsWilma Torres MRN: [id number] DOB: 01/20/1931 Today's Date: 01/19/2017 Time: [id number] SLP Time Calculation (min) (ACUTE ONLY): 17 min  Assessment / Plan / Recommendation Clinical Impression  Pt demonstrates improved under subjective assessment today. SLP provided trials of thin liquids - water and coffee, over 6 oz given with cough on initial sip only, no persistent throat clearing as seen in prior visit. Pt able to feed herself, independently takes small careful sips. Recommend pt resume a regular diet and thin liquids with assist for basic aspiration precautions. Discussed with daughter.Will f/u for tolerance.    HPI HPI: Ms.Terri Torres a 83 y.o.femalewith history of A. fib not on anticoagulation, CHF on aspirin, dementia admitted for slurred speech, right-sided weakness. 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 Also with SAH, right frontal likely related to TPA use, reversed with tranexamic acid. Also with GI bleed.       SLP Plan  Continue with current plan of care       Recommendations  Diet recommendations: Regular;Thin liquid Liquids provided via: Cup Medication Administration: Whole meds with puree Supervision: Patient able to self feed;Full supervision/cueing for compensatory strategies Compensations: Slow rate;Small sips/bites Postural Changes and/or Swallow Maneuvers: Seated upright 90 degrees                Plan: Continue with current plan of care       GO               [facility] Center Of WestchesterBonnie Radie Berges, MA CCC-SLP [phone]  Terri Torres, Terri Torres 01/19/2017, 10:30 AM

## 2017-01-19 NOTE — Consult Note (Signed)
[facility] CM Primary Care Navigator  01/19/2017  Terri Torres [birth year] [id number]   Met with patient and daughter Terri Torres) at the bedside to identify possible discharge needs. Daughter reports that patient had "garbled, gibberish speech" that resulted to this admission.  Patient's daughter endorses Terri Torres, [place] with [facility] as patient's primary care provider.   Patient's daughter shared using [facility] pharmacy in [place] to obtain medications without any problem.   Daughter manages medications for her at home straight out of the containers.   Her daughter has been providing transportation to her doctors' appointments.  Patient's daughter stays at patient's house and serving as the primary caregiver at home. Daughter states that patient has a "good support group from the family" to assist and provide care for her.  Anticipated discharge plan is home per daughter with home health services as recommended by therapy.  Patient's daughter voiced understanding to call primary care provider's office when she returns home for a post discharge follow-up appointment within a week or sooner if needs arise. Patient letter (with PCP's contact number) was provided as a reminder.  Explained to patient's daughter about [facility] CM services available for health management at home, but she denies any current needs or concerns at this point.   She verbally agreed forEMMI strokecalls tofollow-up patient at home as she recovers.  Notedorder for EMMI stroke calls after discharge already in place.  Patient's daughter voiced understandingto seek referral from primary care provider to [facility] care management ifnecessaryand deemed appropriate for services in the future.  [facility] care management information provided for future needs that she may have.   For additional questions please contact:  Terri Torres, BSN, RN-BC [facility] PRIMARY CARE Navigator Cell: [phone]

## 2017-01-19 NOTE — Progress Notes (Signed)
STROKE TEAM PROGRESS NOTE   SUBJECTIVE (INTERVAL HISTORY) Her daughter is at the bedside. Patient is found sitting up in bed in NAD.  Passed SLP evaluation this AM. Dementia at baseline per daughters report. Continues to move all extremities well. Daughter and patient voice no new complaints. No acute events reported by nursing overnight.   OBJECTIVE Temp:  [98.5 F (36.9 C)-99.8 F (37.7 C)] 98.6 F (37 C) (11/09 1005) Pulse Rate:  [92-114] 109 (11/09 1005) Cardiac Rhythm: Atrial fibrillation (11/09 0800) Resp:  [19-32] 19 (11/09 1005) BP: (117-150)/(67-121) 138/67 (11/09 1005) SpO2:  [93 %-99 %] 98 % (11/09 1005) Weight:  [74.2 kg (163 lb 8 oz)] 74.2 kg (163 lb 8 oz) (11/08 2150)  Recent Labs  Lab 01/17/17 2001 01/17/17 2350 01/18/17 1149 01/19/17 0613 01/19/17 1150  GLUCAP 116* 113* 109* 109* 112*   Recent Labs  Lab 01/17/17 0347 01/17/17 0410 01/18/17 0337 01/19/17 0351  NA 138 140 139 138  K 4.1 4.1 3.4* 3.7  CL 106 106 103 103  CO2 23  --  26 27  GLUCOSE 183* 182* 111* 108*  BUN 13 15 17 15   CREATININE 0.88 0.80 0.96 0.82  CALCIUM 8.9  --  9.4 9.3  MG 1.7  --  1.6* 1.8  PHOS 4.8*  --  3.2  --    Recent Labs  Lab 01/17/17 0347  AST 30  ALT 16  ALKPHOS 91  BILITOT 0.7  PROT 6.2*  ALBUMIN 3.1*   Recent Labs  Lab 01/17/17 0347 01/17/17 0410 01/18/17 0337  WBC 8.6  --  19.5*  NEUTROABS 6.3  --  15.6*  HGB 12.1 12.2 12.6  HCT 39.1 36.0 36.4  MCV 91.8  --  87.1  PLT 221  --  238   No results for input(s): CKTOTAL, CKMB, CKMBINDEX, TROPONINI in the last 168 hours. Recent Labs    01/17/17 0347 01/17/17 1159  LABPROT 13.9 13.4  INR 1.08 1.03   Recent Labs    01/17/17 0413  COLORURINE YELLOW  LABSPEC >1.046*  PHURINE 5.0  GLUCOSEU NEGATIVE  HGBUR NEGATIVE  BILIRUBINUR NEGATIVE  KETONESUR NEGATIVE  PROTEINUR NEGATIVE  NITRITE NEGATIVE  LEUKOCYTESUR NEGATIVE       Component Value Date/Time   CHOL 176 01/18/2017 0337   TRIG 83  01/18/2017 0337   HDL 58 01/18/2017 0337   CHOLHDL 3.0 01/18/2017 0337   VLDL 17 01/18/2017 0337   LDLCALC 101 (H) 01/18/2017 0337   Lab Results  Component Value Date   HGBA1C 5.4 01/18/2017   No results found for: LABOPIA, COCAINSCRNUR, LABBENZ, AMPHETMU, THCU, LABBARB  No results for input(s): ETH in the last 168 hours.  I have personally reviewed the radiological images below and agree with the radiology interpretations.  Ct Head Wo Contrast 01/17/2017 IMPRESSION: 1. Right frontal convexity acute subarachnoid hemorrhage without mass effect or hydrocephalus. 2. Mildly thickened appearance of the posterosuperior sagittal sinus may indicate a very small component of subdural blood adjacent to the falx cerebri. Alternatively, this appearance could be due to retained intravenous contrast within the dural venous sinuses.   Mri and Mra Head Wo Contrast 01/17/2017 IMPRESSION: 1. Small acute left MCA infarcts. 2. Right-sided subarachnoid hemorrhage as seen on CT. Suspected small amount of acute cortical/subcortical infarction in the anterior right frontal operculum region, though much of the abnormal diffusion signal in the right hemisphere is attributed to hemorrhage. 3. Moderate chronic small vessel ischemic disease and cerebral atrophy. 4. No large vessel occlusion. Partial  recanalization of previously described left M2 branch vessel occlusion.   Ct Head Wo Contrast 01/18/2017 IMPRESSION: 1. Unchanged appearance of right frontal lobe subarachnoid hemorrhage. No new hemorrhage identified. 2. Hypoattenuation in the left caudate head consistent with known infarct. No midline shift or hydrocephalus.   2D Echocardiogram   - Left ventricle: The cavity size was normal. There was mild   concentric hypertrophy. Systolic function was severely reduced.   The estimated ejection fraction was in the range of 25% to 30%.   Diffuse hypokinesis. - Ventricular septum: Septal motion showed abnormal function  and   dyssynchrony. - Aortic valve: Transvalvular velocity was within the normal range.   There was no stenosis. There was no regurgitation. - Mitral valve: Transvalvular velocity was within the normal range.   There was no evidence for stenosis. There was no regurgitation. - Left atrium: The atrium was moderately to severely dilated. - Right ventricle: The cavity size was normal. Wall thickness was   normal. Systolic function was normal. - Right atrium: The atrium was moderately dilated. - Atrial septum: A patent foramen ovale cannot be excluded. - Tricuspid valve: There was mild regurgitation. - Pulmonary arteries: Systolic pressure was within the normal   range. PA peak pressure: 36 mm Hg (S).   PHYSICAL EXAM  Temp:  [98.5 F (36.9 C)-99.8 F (37.7 C)] 98.6 F (37 C) (11/09 1005) Pulse Rate:  [92-114] 109 (11/09 1005) Resp:  [19-32] 19 (11/09 1005) BP: (117-150)/(67-121) 138/67 (11/09 1005) SpO2:  [93 %-99 %] 98 % (11/09 1005) Weight:  [74.2 kg (163 lb 8 oz)] 74.2 kg (163 lb 8 oz) (11/08 2150)  General - Well nourished, well developed, extubated, not in distress  Ophthalmologic - fundi not visualized due to noncooperation.  Cardiovascular - irregularly irregular heart rate and rhythm, with intermittent RVR  Neuro - awake alert, eyes open, able to close eyes on command only, and then perseverated on further commands. Able to have words out, but does not make sense, hypophonia, severe dysarthria, PERRL, attending to both sides, tracking objects, no significant facial droop, tongue in midline. Symmetrical bilateral upper extremity against gravity, at least 3+/5, bilateral lower extremity withdraws to pain, able to hold with knee flexion, left LE mild pain due to previous hip fracture s/p screws. Symmetrical bilaterally.  DTRs 1+, no Babinski. Sensation, coordination not cooperative and gait not tested.  ASSESSMENT/PLAN Ms. Waynard EdwardsWilma Casamento is a 83 y.o. female with history of A. fib  not on anticoagulation, CHF on aspirin, dementia admitted for slurred speech, right-sided weakness. tPA given due to an outside hospital.    01/19/17: Neuro exam remains stable. Passed SLP eval today. POC discussed with daughter about need for Repeat Head CT in 10 days and PCP to start Eliquis if bleeding resolved on imaging. Verbalizes good understanding.  Stroke:  left BG and insular cortex small infarcts embolic secondary to A. fib not on anticoagulation.  Resultant ? Aphasia vs. Dysarthria - passed SLP eval today  CTA head and neck left M2 occlusion, proximal ICAs widely patent  Repeat CT after TPA - right frontal SAH  MRI left BG and intracardiac small infarcts, and right frontal SAH  MRA motion degraded, partially recanalization of left M2  2D Echo EF 25-30%  LDL 101  HgbA1c 5.4  SCDs for VTE prophylaxis  Diet regular Room service appropriate? Yes; Fluid consistency: Thin   aspirin 81 mg daily prior to admission, now on [facility]ithrombotic [facility]    Patient will need anticoagulation  for stroke prevention.  Daughter has agreed to start Eliquis after SAH resolves.  Ongoing aggressive stroke risk factor management  Therapy recommendations: Likely HH  Disposition: Likely home  SAH: Right frontal status post TPA  Likely related to TPA use  TPA reversed with tranexamic acid  MRI brain showed stable right frontal SAH  Repeat CT 01/18/18 - unchanged SAH  Patient will need Repeat Head CT in 10 days, if bleeding resolved, PCP may start Eliquis.  GI bleeding status post TPA  Rectal bleeding after TPA  Resolved before GI service consultation  No specific concern from GI service  CBC stable  PCP to closely monitor patient for GI bleeding after starting Eliquis  Chronic A. fib not on anticoagulation  History of fall, but infrequent  In the past, family refused Xarelto or Eliquis due to no antidote  Daughter has now agreed to start Eliquis after Repeat Head  CT in 10 days. She will work with her PCP to arrange.  Would recommend Eliquis 2.5 mg BID  CHF with low EF  2D echo showed EF 25-30%  On HCTZ and coreg at home  CCM on board  outpt follow up with cardiology  Discussed with daughter about anticoagulation when [facility] resolves. She states she is comfortable having PCP manage this issue.  Hypertension Stable  BP goal normotensive  Hyperlipidemia  Home meds: None  LDL 101, goal < 70  Add lipitor 20  Continue statin on discharge.  Other Stroke Risk Factors  Advanced age  Other Active Problems  Dementia -lives at home with daughter, need help for daily activities such as bathing, dressing, however able to walk with his walker inside the house, seldomly go outside hospital.  Leukocytosis - 8.6->19.5 (afebrile) - remains afebrile, no new CBC today  Hospital day # 2  Brita Romp Stroke Neurology Team 01/19/2017 12:37 PM   I reviewed above note and agree with the assessment and plan. I have made any additions or clarifications directly to the above note. Pt was seen and examined. Pt back to baseline as per daughter. Yesterday CT still showed unchanged SAH. PT/OT recommend home PT/OT. Recommend to repeat CT head in 10 days, if SAH resolved, consider to start eliquis 2.5mg  bid. Close monitoring GIB after eliquis start due to brief LGIB after tPA. PCP to arrange repeat CT and initiation of eliquis. Daughter will call PCP for arrangement.   Neurology will sign off. Please call with questions. Pt will follow up with Darrol Angel, NP, at [facility] in about 6 weeks. Thanks for the consult.  Marvel Plan, MD PhD Stroke Neurology 01/19/2017 9:55 PM     on  To contact Stroke Continuity provider, please refer to [email or web address]. After hours, contact General Neurology

## 2017-01-19 NOTE — Evaluation (Signed)
Occupational Therapy Evaluation Patient Details Name: Waynard EdwardsWilma Grewe MRN: [id number] DOB: 05/06/1930 Today's Date: 01/19/2017    History of Present Illness Ms. Waynard EdwardsWilma Dedominicis is a 83 y.o. female admitted for slurred speech, right-sided weakness. 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 Also with SAH, right frontal likely related to TPA use, reversed with tranexamic acid. Also with GI bleed.  Pt intubated 11/7-11/8. Prior history of A. fib not on anticoagulation, CHF on aspirin, dementia, prior L hip fx fixed with screws.     Clinical Impression   Pt with decline in function and safety with ADLs and ADL mobility with decreased strength, balance and endurance. Pt with hx of cognitive impairments. Pt would benefit from acute OT services to address impairments to maximize level of function and safety    Follow Up Recommendations  Home health OT;Supervision/Assistance - 24 hour    Equipment Recommendations  None recommended by OT    Recommendations for Other Services       Precautions / Restrictions Precautions Precautions: Fall Restrictions Weight Bearing Restrictions: No      Mobility Bed Mobility Overal bed mobility: Needs Assistance Bed Mobility: Supine to Sit     Supine to sit: Min assist     General bed mobility comments: minA for mangement of LE off bed and pad scoot of hips to EoB, difficulty with initiating task but one started pt participatory  Transfers Overall transfer level: Needs assistance Equipment used: Rolling walker (2 wheeled) Transfers: Sit to/from Stand Sit to Stand: Min assist;+2 safety/equipment         General transfer comment: minA for steadying in standing, vc for hand placement on recliner to push off and reach back for armrest caused pt confusion however when left to her own devices exhibit good placement for power up and lowering down    Balance Overall balance assessment: Needs assistance Sitting-balance support: Feet supported;No  upper extremity supported Sitting balance-Leahy Scale: Good     Standing balance support: No upper extremity supported;During functional activity Standing balance-Leahy Scale: Fair Standing balance comment: pt stopped during ambulation and was able to take both hands off RW to blow nose                           ADL either performed or assessed with clinical judgement   ADL Overall ADL's : Needs assistance/impaired     Grooming: Wash/dry hands;Wash/dry face;Standing   Upper Body Bathing: Set up;Supervision/ safety   Lower Body Bathing: Moderate assistance   Upper Body Dressing : Set up;Supervision/safety   Lower Body Dressing: Moderate assistance   Toilet Transfer: Minimal assistance;RW;Ambulation StatisticianToilet Transfer Details (indicate cue type and reason): simulated to recliner Toileting- Clothing Manipulation and Hygiene: Moderate assistance;Sit to/from stand       Functional mobility during ADLs: Minimal assistance       Vision Baseline Vision/History: Wears glasses Wears Glasses: Reading only Patient Visual Report: No change from baseline       Perception     Praxis      Pertinent Vitals/Pain Pain Assessment: Faces Faces Pain Scale: Hurts a little bit Pain Location: generalized with movement Pain Descriptors / Indicators: Grimacing Pain Intervention(s): Monitored during session;Repositioned     Hand Dominance Right   Extremity/Trunk Assessment Upper Extremity Assessment Upper Extremity Assessment: Generalized weakness   Lower Extremity Assessment Lower Extremity Assessment: Defer to PT evaluation       Communication Communication Communication: No difficulties   Cognition Arousal/Alertness: Awake/alert  Behavior During Therapy: WFL for tasks assessed/performed Overall Cognitive Status: History of cognitive impairments - at baseline Area of Impairment: Orientation;Attention;Memory;Following commands;Safety/judgement;Awareness;Problem  solving                 Orientation Level: Disoriented to;Place;Time;Situation Current Attention Level: Alternating Memory: Decreased short-term memory Following Commands: Follows multi-step commands inconsistently Safety/Judgement: Decreased awareness of safety Awareness: Emergent Problem Solving: Requires verbal cues;Difficulty sequencing;Slow processing General Comments: pt has difficulty follow commands unless they are functional to her at the time   General Comments  Daughter present throughout session     Exercises     Shoulder Instructions      Home Living Family/patient expects to be discharged to:: Private residence Living Arrangements: Children Available Help at Discharge: Family;Available 24 hours/day Type of Home: House Home Access: Stairs to enter Entergy CorporationEntrance Stairs-Number of Steps: 3   Home Layout: One level     Bathroom Shower/Tub: Chief Strategy OfficerTub/shower unit   Bathroom Toilet: (3 in 1 over toilet) Bathroom Accessibility: Yes   Home Equipment: Walker - 2 wheels;Bedside commode          Prior Functioning/Environment Level of Independence: Needs assistance  Gait / Transfers Assistance Needed: ambulates household distances with Rw ADL's / Homemaking Assistance Needed: pt able to dress herself if clothes laid out for her, needs assist with iADLs            OT Problem List: Decreased strength;Decreased activity tolerance;Decreased knowledge of use of DME or AE;Decreased cognition;Impaired balance (sitting and/or standing)      OT Treatment/Interventions: Self-care/ADL training;DME and/or AE instruction;Therapeutic activities;Therapeutic exercise;Patient/family education    OT Goals(Current goals can be found in the care plan section) Acute Rehab OT Goals Patient Stated Goal: none stated OT Goal Formulation: With patient/family Time For Goal Achievement: 01/26/17 Potential to Achieve Goals: Good ADL Goals Pt Will Perform Grooming: with min guard  assist;with supervision;with set-up Pt Will Perform Upper Body Bathing: with set-up Pt Will Perform Lower Body Bathing: with min assist;with caregiver independent in assisting Pt Will Perform Upper Body Dressing: with set-up;with caregiver independent in assisting Pt Will Perform Lower Body Dressing: with min assist;with caregiver independent in assisting Pt Will Transfer to Toilet: with min guard assist;ambulating;regular height toilet;grab bars Pt Will Perform Toileting - Clothing Manipulation and hygiene: with min assist;with min guard assist;sit to/from stand;with caregiver independent in assisting Pt Will Perform Tub/Shower Transfer: with min guard assist;shower seat;3 in 1;rolling walker;with caregiver independent in assisting  OT Frequency: Min 2X/week   Barriers to D/C:    no barriers       Co-evaluation PT/OT/SLP Co-Evaluation/Treatment: Yes Reason for Co-Treatment: Necessary to address cognition/behavior during functional activity;To address functional/ADL transfers PT goals addressed during session: Mobility/safety with mobility;Balance;Proper use of DME OT goals addressed during session: ADL's and self-care;Proper use of Adaptive equipment and DME      AM-PAC PT "6 Clicks" Daily Activity     Outcome Measure Help from another person eating meals?: None Help from another person taking care of personal grooming?: A Little Help from another person toileting, which includes using toliet, bedpan, or urinal?: A Little Help from another person bathing (including washing, rinsing, drying)?: A Little Help from another person to put on and taking off regular upper body clothing?: A Little Help from another person to put on and taking off regular lower body clothing?: A Lot 6 Click Score: 18   End of Session Equipment Utilized During Treatment: Gait belt Nurse Communication: Mobility status  Activity Tolerance: Patient tolerated  treatment well Patient left: in chair;with call  bell/phone within reach;with chair alarm set;with family/visitor present  OT Visit Diagnosis: Unsteadiness on feet (R26.81);Muscle weakness (generalized) (M62.81);Other symptoms and signs involving cognitive function;Pain;History of falling (Z91.81) Pain - Right/Left: Right Pain - part of body: Leg                Time: 1096-0454 OT Time Calculation (min): 42 min Charges:  OT General Charges $OT Visit: 1 Visit OT Evaluation $OT Eval Moderate Complexity: 1 Mod OT Treatments $Therapeutic Activity: 8-22 mins G-Codes: OT G-codes **NOT FOR INPATIENT CLASS** Functional Assessment Tool Used: AM-PAC 6 Clicks Daily Activity     Galen Manila 01/19/2017, 2:08 PM

## 2017-01-20 ENCOUNTER — Other Ambulatory Visit: Payer: Self-pay

## 2017-01-20 LAB — BASIC METABOLIC PANEL
Anion gap: 7 (ref 5–15)
BUN: 13 mg/dL (ref 6–20)
CALCIUM: 9.5 mg/dL (ref 8.9–10.3)
CO2: 26 mmol/L (ref 22–32)
CREATININE: 0.79 mg/dL (ref 0.44–1.00)
Chloride: 103 mmol/L (ref 101–111)
GFR calc Af Amer: >60 mL/min (ref >60)
GLUCOSE: 97 mg/dL (ref 65–99)
Potassium: 3.6 mmol/L (ref 3.5–5.1)
Sodium: 136 mmol/L (ref 135–145)

## 2017-01-20 LAB — CBC
HEMATOCRIT: 37.4 % (ref 36.0–46.0)
Hemoglobin: 11.7 g/dL — ABNORMAL LOW (ref 12.0–15.0)
MCH: 28.4 pg (ref 26.0–34.0)
MCHC: 31.3 g/dL (ref 30.0–36.0)
MCV: 90.8 fL (ref 78.0–100.0)
PLATELETS: 209 10*3/uL (ref 150–400)
RBC: 4.12 MIL/uL (ref 3.87–5.11)
RDW: 13.3 % (ref 11.5–15.5)
WBC: 8.6 10*3/uL (ref 4.0–10.5)

## 2017-01-20 LAB — GLUCOSE, CAPILLARY
Glucose-Capillary: 96 mg/dL (ref 65–99)
Glucose-Capillary: 92 mg/dL (ref 65–99)

## 2017-01-20 LAB — MAGNESIUM: Magnesium: 1.8 mg/dL (ref 1.7–2.4)

## 2017-01-20 LAB — TRIGLYCERIDES: Triglycerides: 109 mg/dL (ref <150)

## 2017-01-20 MED ORDER — CARVEDILOL 6.25 MG PO TABS: 6.2500 mg | ORAL_TABLET | Freq: Two times a day (BID) | ORAL | 0 refills | Status: AC

## 2017-01-20 MED ORDER — ATORVASTATIN CALCIUM 20 MG PO TABS: 20.0000 mg | ORAL_TABLET | Freq: Every day | ORAL | 0 refills | Status: AC

## 2017-01-20 MED ORDER — POLYETHYLENE GLYCOL 3350 17 G PO PACK: 17.0000 g | PACK | Freq: Every day | ORAL | 0 refills | Status: AC | PRN

## 2017-01-20 MED ORDER — SENNOSIDES-DOCUSATE SODIUM 8.6-50 MG PO TABS: 1.0000 | ORAL_TABLET | Freq: Every evening | ORAL | 0 refills | Status: AC | PRN

## 2017-01-20 MED ORDER — FUROSEMIDE 20 MG PO TABS: 20.0000 mg | ORAL_TABLET | ORAL | 0 refills | Status: AC | PRN

## 2017-01-20 MED ORDER — LOSARTAN POTASSIUM 25 MG PO TABS: 25.0000 mg | ORAL_TABLET | Freq: Every day | ORAL | 0 refills | Status: AC

## 2017-01-20 MED ORDER — LOSARTAN POTASSIUM 50 MG PO TABS: 25.0000 mg | ORAL_TABLET | Freq: Every day | ORAL | Status: DC

## 2017-01-20 NOTE — Progress Notes (Signed)
Physical Therapy Treatment Patient Details Name: Terri Torres MRN: [id number] DOB: 05/02/1930 Today's Date: 01/20/2017    History of Present Illness [name] is a 83 y.o. female admitted for slurred speech, right-sided weakness. 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 Also with SAH, right frontal likely related to TPA use, reversed with tranexamic acid. Also with GI bleed.  Pt intubated 11/7-11/8. Prior history of A. fib not on anticoagulation, CHF on aspirin, dementia, prior L hip fx fixed with screws.      PT Comments    Today's skilled session focused on gait training and sit<>stand for exercise. Pt fatigues quickly with ambulation, requiring seated break after 20 ft. Pt would benefit from continued skilled PT to increase safety with mobility and functional independence. Will continue to follow acutely.   Follow Up Recommendations  Home health PT;Supervision/Assistance - 24 hour     Equipment Recommendations  None recommended by PT    Recommendations for Other Services       Precautions / Restrictions Precautions Precautions: Fall Restrictions Weight Bearing Restrictions: No    Mobility  Bed Mobility               General bed mobility comments: in chair on arrival  Transfers Overall transfer level: Needs assistance Equipment used: Rolling walker (2 wheeled) Transfers: Sit to/from Stand Sit to Stand: Min guard         General transfer comment: Pt reliant on arm rests to power up into standing. No physical assist required, min guard for safety. Cueing for pt to control descent into chair.  Ambulation/Gait Ambulation/Gait assistance: Min assist;+2 physical assistance(close chair follow) Ambulation Distance (Feet): 25 Feet Assistive device: Rolling walker (2 wheeled) Gait Pattern/deviations: Step-through pattern;Decreased step length - right;Decreased stance time - left;Decreased weight shift to left;Trunk flexed Gait velocity:  decreased Gait velocity interpretation: Below normal speed for age/gender General Gait Details: steady gait, with good use of RW, minA for steadying, no overt LoB. Pt fatigues quickly, requiring steated rest breaks.   Stairs            Wheelchair Mobility    Modified Rankin (Stroke Patients Only) Modified Rankin (Stroke Patients Only) Pre-Morbid Rankin Score: Moderately severe disability Modified Rankin: Moderately severe disability     Balance Overall balance assessment: Needs assistance Sitting-balance support: Feet supported;No upper extremity supported Sitting balance-Leahy Scale: Good     Standing balance support: No upper extremity supported;During functional activity Standing balance-Leahy Scale: Fair Standing balance comment: pt stopped during ambulation and was able to take both hands off RW to blow nose                            Cognition Arousal/Alertness: Awake/alert Behavior During Therapy: WFL for tasks assessed/performed Overall Cognitive Status: History of cognitive impairments - at baseline Area of Impairment: Orientation;Attention;Memory;Following commands;Safety/judgement;Awareness;Problem solving                       Following Commands: Follows multi-step commands inconsistently Safety/Judgement: Decreased awareness of safety   Problem Solving: Requires verbal cues;Difficulty sequencing;Slow processing General Comments: pt has difficulty follow commands unless they are functional to her at the time. Wants to understand why things are asked of her.      Exercises Other Exercises Other Exercises: sit<>stand x6; cueing for technqiue and controlled descent    General Comments General comments (skin integrity, edema, etc.): daughter present      Pertinent  Vitals/Pain Pain Assessment: No/denies pain    Home Living                      Prior Function            PT Goals (current goals can now be found in the  care plan section) Acute Rehab PT Goals Patient Stated Goal: none stated PT Goal Formulation: With patient/family Potential to Achieve Goals: Fair Progress towards PT goals: Progressing toward goals    Frequency    Min 4X/week      PT Plan Current plan remains appropriate    Co-evaluation              AM-PAC PT "6 Clicks" Daily Activity  Outcome Measure  Difficulty turning over in bed (including adjusting bedclothes, sheets and blankets)?: Unable Difficulty moving from lying on back to sitting on the side of the bed? : Unable Difficulty sitting down on and standing up from a chair with arms (e.g., wheelchair, bedside commode, etc,.)?: Unable Help needed moving to and from a bed to chair (including a wheelchair)?: A Little Help needed walking in hospital room?: A Little Help needed climbing 3-5 steps with a railing? : A Lot 6 Click Score: 11    End of Session Equipment Utilized During Treatment: Gait belt Activity Tolerance: Patient tolerated treatment well Patient left: in chair;with call bell/phone within reach;with chair alarm set;with family/visitor present Nurse Communication: Mobility status PT Visit Diagnosis: Unsteadiness on feet (R26.81);Other abnormalities of gait and mobility (R26.89);Muscle weakness (generalized) (M62.81);Difficulty in walking, not elsewhere classified (R26.2);Other symptoms and signs involving the nervous system (R29.898)     Time: [id number] PT Time Calculation (min) (ACUTE ONLY): 19 min  Charges:  $Therapeutic Activity: 8-22 mins                    G Codes:      Kallie LocksHannah Lian Pounds, VirginiaPTA Pager [phone] Acute Rehab   Sheral ApleyHannah E Kashawna Manzer 01/20/2017, 12:30 PM

## 2017-01-20 NOTE — Progress Notes (Signed)
Pt being discharged per orders from MD. Pt and family educated on discharge instructions. Pt and family verbalized understanding of instructions. All questions and concerns were addressed. Pt's IV was removed prior to discharge. Pt exited hospital via wheelchair. 

## 2017-01-20 NOTE — Progress Notes (Signed)
01/20/17 1400  PT Visit Information  Last PT Received On 01/20/17  Assistance Needed +1  History of Present Illness [name] is a 83 y.o. female admitted for slurred speech, right-sided weakness. 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 Also with SAH, right frontal likely related to TPA use, reversed with tranexamic acid. Also with GI bleed.  Pt intubated 11/7-11/8. Prior history of A. fib not on anticoagulation, CHF on aspirin, dementia, prior L hip fx fixed with screws.    Subjective Data  Patient Stated Goal none stated  Precautions  Precautions Fall  Restrictions  Weight Bearing Restrictions No  Pain Assessment  Pain Assessment No/denies pain  Cognition  Arousal/Alertness Awake/alert  Behavior During Therapy WFL for tasks assessed/performed  Overall Cognitive Status History of cognitive impairments - at baseline  Area of Impairment Orientation;Attention;Memory;Following commands;Safety/judgement;Awareness;Problem solving  Following Commands Follows multi-step commands inconsistently  Safety/Judgement Decreased awareness of safety  Problem Solving Requires verbal cues;Difficulty sequencing;Slow processing  General Comments pt has difficulty follow commands unless they are functional to her at the time. Wants to understand why things are asked of her.  Bed Mobility  General bed mobility comments Standing in room on arrival  Transfers  Overall transfer level Needs assistance  Equipment used Rolling walker (2 wheeled)  Transfers Sit to/from Stand  Sit to Stand Min guard  General transfer comment Pt reliant on arm rests to power up into standing. No physical assist required, min guard for safety.  Ambulation/Gait  Ambulation/Gait assistance Min assist (close chair follow)  Ambulation Distance (Feet) 12 Feet  Assistive device Rolling walker (2 wheeled)  Gait Pattern/deviations Step-through pattern;Decreased step length - right;Decreased stance time -  left;Decreased weight shift to left;Trunk flexed  General Gait Details steady gait, with good use of RW, minA for steadying, no overt LoB. Pt fatigues quickly,  Gait velocity decreased  Gait velocity interpretation Below normal speed for age/gender  Modified Rankin (Stroke Patients Only)  Pre-Morbid Rankin Score 4  Modified Rankin 4  Balance  Overall balance assessment Needs assistance  Sitting-balance support Feet supported;No upper extremity supported  Sitting balance-Leahy Scale Good  Standing balance support No upper extremity supported;During functional activity  Standing balance-Leahy Scale Fair  General Comments  General comments (skin integrity, edema, etc.) grandaughter present  PT - End of Session  Equipment Utilized During Treatment Gait belt  Activity Tolerance Patient tolerated treatment well  Patient left in chair;with call bell/phone within reach;with chair alarm set;with family/visitor present  Nurse Communication Mobility status  PT - Assessment/Plan  PT Plan Current plan remains appropriate  PT Visit Diagnosis Unsteadiness on feet (R26.81);Other abnormalities of gait and mobility (R26.89);Muscle weakness (generalized) (M62.81);Difficulty in walking, not elsewhere classified (R26.2);Other symptoms and signs involving the nervous system (R29.898)  PT Frequency (ACUTE ONLY) Min 4X/week  Follow Up Recommendations Home health PT;Supervision/Assistance - 24 hour  PT equipment None recommended by PT  AM-PAC PT "6 Clicks" Daily Activity Outcome Measure  Difficulty turning over in bed (including adjusting bedclothes, sheets and blankets)? 1  Difficulty moving from lying on back to sitting on the side of the bed?  1  Difficulty sitting down on and standing up from a chair with arms (e.g., wheelchair, bedside commode, etc,.)? 1  Help needed moving to and from a bed to chair (including a wheelchair)? 3  Help needed walking in hospital room? 3  Help needed climbing 3-5 steps with  a railing?  2  6 Click Score 11  Mobility G  Code  CL  PT Goal Progression  Progress towards PT goals Progressing toward goals  Acute Rehab PT Goals  PT Goal Formulation With patient/family  Potential to Achieve Goals Fair  PT Time Calculation  PT Start Time (ACUTE ONLY) 1422  PT Stop Time (ACUTE ONLY) 1435  PT Time Calculation (min) (ACUTE ONLY) 13 min  PT General Charges  $$ ACUTE PT VISIT 1 Visit  PT Treatments  $Therapeutic Activity 8-22 mins   Pt's chair alarm was going off, no NT or RNs available. On arrival to pt's room pt was standing up and attempting to ambulate to bathroom with IV still plugged into wall. PTA intervened to assist pt safely to bathroom. Pt able to stand and perform her own pericare, however required assist to hold gown. Pt ambulated with min guard to return to recliner. Reminded pt of the importance of having RN present to assist with lines and safety when needing to get up. Will continue to follow to maximize safety with mobility.  Kallie LocksHannah Jondavid Schreier, PTA Pager [phone] Acute Rehab

## 2017-01-21 ENCOUNTER — Telehealth: Payer: Self-pay | Admitting: *Deleted

## 2017-01-21 NOTE — Telephone Encounter (Signed)
Daughter returned call to say no HH needed at this time. CM made her aware that she could call PCP if condition worsened or if the family changes mind. Family expressed appreciation and verbalized understanding.

## 2017-01-23 NOTE — Discharge Summary (Addendum)
[facility] Discharge Summary   Patient: Terri EdwardsWilma Torres ZOX:[id number]   PCP: Terri Torres, [name], NP DOB: 06/04/1930   Date of admission: 01/17/2017   Date of discharge: 01/20/2017     Discharge Diagnoses:  Active Problems:   SAH (subarachnoid hemorrhage) (HCC)   Acute ischemic left MCA stroke (HCC)   CHF (congestive heart failure) (HCC)   Cardiomyopathy (HCC)   Dementia   HTN (hypertension)   Rectal bleeding   Chronic atrial fibrillation (HCC)   Admitted From: Home Disposition: Home with home health  Recommendations for Outpatient Follow-up:  1. Please follow-up with PCP in 1 week.  Patient needs Torres repeat CT scan around 01/30/2017, and if there is resolution of SAH patient should be started on Eliquis 2.5 mg twice daily for history of Torres. fib and CVA. 2. Please follow-up with cardiology to establish care. 3. Also recommend Torres follow-up with neurology in 6 weeks. 4. [facility] as needed  Follow-up Information    Nilda RiggsMartin, Terri Carolyn, NP. Schedule an appointment as soon as possible for Torres visit in 6 week(s).   Specialty:  Family Medicine Contact information: [address] [place] [address] [phone]        [facility] Follow up.   Why:  we will arrange for follow-up and contact you. Contact information: [address] [place] [phone]       Terri Torres, [name], NP. Schedule an appointment as soon as possible for Torres visit in 1 week(s).   Specialty:  Internal Medicine Contact information: [address] [place] [address] [phone]        [facility]. Schedule an appointment as soon as possible for Torres visit in 1 month(s).   Why:  establish care  Contact information: [address] [place] [address] [phone]          Diet recommendation: Cardiac diet  Activity: The patient is  advised to gradually reintroduce usual activities.  Discharge Condition: good  Code Status: DNR/DNI  History of present illness: As per the H and P dictated on admission, "83 year old female with past medical history as below, which is significant for atrial fibrillation with rate control.  She was initially ordered DOAC for anticoagulation, but ended up not taking this due to falls.  After Torres fall about Torres year ago when she broke her wrist her daughter has been living with her.  Her daughter notices some memory changes recently, otherwise she remains in her usual state of health until 11/6 in the late evening.  She developed difficulty speaking and was taken to [facility] where CT angiogram described Torres left M2 occlusion and she was given TPA.  She was [facility] for perfusion study and consideration of interventional radiology procedure.  [facility] the patient had developed shortness of breath and hypoxemia with worsening mental status.  She was intubated in the emergency department.  CT scan was repeated demonstrating Torres right frontal acute subarachnoid hemorrhage and Torres small component of subdural bleeding as well.  There is no mass-effect identified.  PCCM asked to admit the ventilated patient."  Hospital Course:  Pt. with PMH of Torres. fib and TIA; admitted on 01/17/2017, presented with complaint of difficulty speaking and right-sided weakness, was found to have acute CVA.  She [facility] 11 6.  Telemetry neurologist was  consulted at [facility] ER and decision to administer TPA was taken given the patient was in window period.  Patient was subsequently transferred to [facility] ER for left M2 occlusion for emergent perfusion study.  On arrival patient was more confused than her initial presentation at [facility] ER and also had rectal and vaginal bleeding.  Repeat CT scan was performed which showed SAH bleeding.  Patient was  given tranexamic acid to reverse TPA.  Intubated for airway protection in the ER and transferred to ICU.  Extubated on 01/18/2017 and transferred to floor.  Summary of her active problems in the hospital is as following. 1.  Acute CVA Left basal ganglia and insular cortex small infarct. Embolic secondary to Torres. fib not being on anticoagulation. Has mild aphasia as well as dysarthria.  Swallow evaluation has cleared the patient. PT OT recommends home with home health. Echocardiogram shows EF of 25-30%. Patient did have M2 occlusion on initial CT scan performed on outside hospital, here MRI shows partial recanalization of left M2. Per neurology patient will need anticoagulation for stroke prevention due to her Torres. fib in future and they would recommend Eliquis to start once SAH resolves. Ended in 10 days on discharge on 01/30/2017.  If the TSH has resolved on that CT scan patient should be started on Eliquis 2.5 mg twice daily if there is no contraindication.  Daughter mentioned that she will coordinate this with her PCP.  2.  Rectal bleed. Most likely S/P TPA. Currently resolved after reversal of the TPA. [facility] was consulted recommend no further workup no inpatient procedures since hemoglobin has stable and no further bleeding after reversal. Outpatient follow-up as well as close monitoring while the patient will be on anticoagulation.  3.  Chronic Torres. fib not on anticoagulation due to recurrent fall. Now the patient is presenting with Torres stroke family is agreeable to start the patient on Eliquis. Neurology recommends to start this medication after Torres repeat CT scan of the head in 10 days. Neurology recommends 2.5 mg twice daily.  4.    Chronic systolic CHF Cardiomyopathy. Ejection fraction 25-30%, diffuse hypokinesis. Not Torres candidate for intervention or procedure due to acute CVA, SAH as well as GI bleed for now. Received prior record of echocardiogram in 2015 shows diffuse wall motion  abnormalities with ejection fraction of 40-45%. Discussed with cardiology attending on call, recommend outpatient close follow-up. Patient currently on carvedilol 6.25 mg twice daily. Also adding losartan. Holding HCTZ right now.  5.  Essential hypertension. Blood pressure stable. Monitor. Patient on HCTZ and Coreg at home. Changing Coreg and losartan for now.  6.  Dementia. Does have dementia at her baseline which may be contributing to her current speech issues as well. Monitor. No behavioral issues right now.  All other chronic medical condition were stable during the hospitalization.  Patient was seen by physical therapy, who recommended home health which was arranged by Child psychotherapist and case Production designer, theatre/television/film. On the day of the discharge the patient's vitals were stable, and no other acute medical condition were reported by patient. the patient was felt safe to be discharge at home with home health.  Procedures and Results:  Echocardiogram   Consultations:  Neurology   Cardiology  DISCHARGE MEDICATION: Discharge Medication List as of 01/20/2017  5:40 PM    START taking these medications   Details  atorvastatin (LIPITOR) 20 MG tablet Take 1 tablet (20 mg total) daily at 6 PM by mouth., Starting Sat 01/20/2017, Normal  losartan (COZAAR) 25 MG tablet Take 1 tablet (25 mg total) daily by mouth., Starting Sun 01/21/2017, Normal    polyethylene glycol (MIRALAX / GLYCOLAX) packet Take 17 g daily as needed by mouth., Starting Sat 01/20/2017, Normal    senna-docusate (SENOKOT-S) 8.6-50 MG tablet Take 1 tablet at bedtime as needed by mouth for mild constipation., Starting Sat 01/20/2017, Normal      CONTINUE these medications which have CHANGED   Details  carvedilol (COREG) 6.25 MG tablet Take 1 tablet (6.25 mg total) 2 (two) times daily with Torres meal by mouth., Starting Sat 01/20/2017, Normal    furosemide (LASIX) 20 MG tablet Take 1 tablet (20 mg total) every three (3) days as  needed by mouth for fluid or edema., Starting Sat 01/20/2017, Normal      CONTINUE these medications which have NOT CHANGED   Details  Multiple Vitamins-Minerals (MULTIVITAMIN ADULT) CHEW Chew 2 tablets daily by mouth., Historical Med    omeprazole (PRILOSEC) 20 MG capsule Take 20 mg daily by mouth., Historical Med      STOP taking these medications     aspirin 81 MG chewable tablet        Allergies  Allergen Reactions  . Macrobid [Nitrofurantoin Macrocrystal] Shortness Of Breath   Discharge Instructions    AMB referral to CHF clinic   Complete by:  As directed    Ambulatory referral to Neurology   Complete by:  As directed    An appointment is requested in approximately: 6 weeks Follow up with stroke clinic Darrol Angel preferred, if not available, then consider Sylvie Farrier, [facility] or Lucia Gaskins whoever is available) at [facility] in about 6-8 weeks. Thanks.   Diet - low sodium heart healthy   Complete by:  As directed    Discharge instructions   Complete by:  As directed    It is important that you read following instructions as well as go over your medication list with RN to help you understand your care after this hospitalization.  Discharge Instructions:  Please follow-up with PCP in one week  Please request your primary care physician to go over all Hospital Tests and Procedure/Radiological results at the follow up,  Please get all Hospital records sent to your PCP by signing hospital release before you go home.   Do not drive, operating heavy machinery, perform activities at heights, swimming or participation in water activities or provide baby sitting services; until you have been seen by Primary Care Physician or Torres Neurologist and advised to do so again. Do not take more than prescribed Pain, Sleep and Anxiety Medications. You were cared for by Torres hospitalist during your hospital stay. If you have any questions about your discharge medications or the care you received while  you were in the hospital after you are discharged, you can call the unit and ask to speak with the hospitalist on call if the hospitalist that took care of you is not available.  Once you are discharged, your primary care physician will handle any further medical issues. Please note that NO REFILLS for any discharge medications will be authorized once you are discharged, as it is imperative that you return to your primary care physician (or establish Torres relationship with Torres primary care physician if you do not have one) for your aftercare needs so that they can reassess your need for medications and monitor your lab values. You Must read complete instructions/literature along with all the possible adverse reactions/side effects for all the Medicines you  take and that have been prescribed to you. Take any new Medicines after you have completely understood and accept all the possible adverse reactions/side effects. Wear Seat belts while driving. If you have smoked or chewed Tobacco in the last 2 yrs please stop smoking and/or stop any Recreational drug use.   Increase activity slowly   Complete by:  As directed      Discharge Exam: Filed Weights   01/18/17 0411 01/18/17 2150 01/20/17 0500  Weight: 68.9 kg (151 lb 14.4 oz) 74.2 kg (163 lb 8 oz) 69.1 kg (152 lb 4.8 oz)   Vitals:   01/20/17 0922 01/20/17 1552  BP: 133/74 (!) 134/57  Pulse: 96 85  Resp: 20 20  Temp: 98.4 F (36.9 C) 98.2 F (36.8 C)  SpO2: 97% 97%   General: Appear in no distress, no Rash; Oral Mucosa moist. Cardiovascular: S1 and S2 Present, no Murmur, no JVD Respiratory: Bilateral Air entry present and Clear to Auscultation, no Crackles, no wheezes Abdomen: Bowel Sound present, Soft and no tenderness Extremities: no Pedal edema, no calf tenderness Neurology: Expressive aphasia and dysarthria.  Bilateral generalized weakness.  The results of significant diagnostics from this hospitalization (including imaging, microbiology,  ancillary and laboratory) are listed below for reference.    Significant Diagnostic Studies: Ct Head Wo Contrast  Result Date: 01/18/2017 CLINICAL DATA:  Stroke follow-up EXAM: CT HEAD WITHOUT CONTRAST TECHNIQUE: Contiguous axial images were obtained from the base of the skull through the vertex without intravenous contrast. COMPARISON:  Brain MRI 01/17/2017 FINDINGS: Brain: Unchanged right frontal subarachnoid blood. No new hemorrhage. Hypoattenuation in the left caudate head consistent with known infarct. There is periventricular hypoattenuation compatible with chronic microvascular disease. No midline shift or hydrocephalus. Brain parenchyma and CSF-containing spaces are normal for age. Vascular: No hyperdense vessel or unexpected calcification. Skull: Normal visualized skull base, calvarium and extracranial soft tissues. Sinuses/Orbits: Mild right maxillary mucosal thickening. Partial opacification of the left frontal sinus. Normal orbits. IMPRESSION: 1. Unchanged appearance of right frontal lobe subarachnoid hemorrhage. No new hemorrhage identified. 2. Hypoattenuation in the left caudate head consistent with known infarct. No midline shift or hydrocephalus. Electronically Signed   By: Deatra Robinson M.D.   On: 01/18/2017 01:16   Ct Head Wo Contrast  Result Date: 01/17/2017 CLINICAL DATA:  Status post tPA administration. Altered mental status. EXAM: CT HEAD WITHOUT CONTRAST TECHNIQUE: Contiguous axial images were obtained from the base of the skull through the vertex without intravenous contrast. COMPARISON:  CTA head and neck 01/17/2017. FINDINGS: Brain: There is acute subarachnoid hemorrhage overlying the right frontal convexity. Small amount of hyperdensity along the posterior falx cerebri is also noted. No intraparenchymal hematoma. There is periventricular hypoattenuation compatible with chronic microvascular disease. Ribs Torres small superior parafalcine meningioma measuring 8 x 6 mm. Vascular: There  is mild intravascular enhancement secondary to contrast-enhanced study performed earlier. Skull: Normal visualized skull base, calvarium and extracranial soft tissues. Sinuses/Orbits: No sinus fluid levels or advanced mucosal thickening. No mastoid effusion. Normal orbits. IMPRESSION: 1. Right frontal convexity acute subarachnoid hemorrhage without mass effect or hydrocephalus. 2. Mildly thickened appearance of the posterosuperior sagittal sinus may indicate Torres very small component of subdural blood adjacent to the falx cerebri. Alternatively, this appearance could be due to retained intravenous contrast within the dural venous sinuses. Critical Value/emergent results were called by telephone at the time of interpretation on 01/17/2017 at 3:45 am to Dr. Rochele Raring , who verbally acknowledged these results. Electronically Signed   By: Caryn Bee  Chase PicketHerman M.D.   On: 01/17/2017 03:47   Mr Maxine GlennMra Head Wo Contrast  Result Date: 01/17/2017 CLINICAL DATA:  Stroke. Acute onset dysphagia and right-sided weakness. IV tPA administered with subsequent subarachnoid hemorrhage. EXAM: MRI HEAD WITHOUT CONTRAST MRA HEAD WITHOUT CONTRAST TECHNIQUE: Multiplanar, multiecho pulse sequences of the brain and surrounding structures were obtained without intravenous contrast. Angiographic images of the head were obtained using MRA technique without contrast. COMPARISON:  Head CT and CTA 01/17/2017 FINDINGS: MRI HEAD FINDINGS Brain: There are small acute left MCA territory infarcts involving the caudate nucleus, posterior insula, and temporal lobe along the sylvian fissure. Abnormal diffusion signal along multiple sulci/ gyri in the right cerebral hemisphere, predominantly in the frontal operculum region, is attributed predominantly to subarachnoid hemorrhage, however Torres small amount of cortical and subcortical infarction is also suspected in the anterior operculum region. There may be Torres subcentimeter subacute infarct along the anterior right  insula. Subarachnoid hemorrhage in multiple right frontal sulci is similar in distribution to the CT earlier today. There is also small volume subarachnoid hemorrhage in the right temporoparietal region and sylvian fissure. There may be minimal extra-axial blood along the posterior falx as described on CT. There is moderate cerebral atrophy. No mass or midline shift is seen. Patchy T2 hyperintensities in the cerebral white matter are nonspecific but compatible with moderate chronic small vessel ischemic disease. Milder chronic small vessel changes are present in the pons. There is Torres small chronic left cerebellar infarct. Vascular: Major intracranial vascular flow voids are preserved. Skull and upper cervical spine: Unremarkable bone marrow signal. Sinuses/Orbits: Bilateral cataract extraction. Mild mucosal thickening in the paranasal sinuses. Small volume pharyngeal fluid in the setting of intubation. Clear mastoid air cells. Other: None. MRA HEAD FINDINGS The visualized distal vertebral arteries are patent with the left being dominant. Patent AICA origins are identified bilaterally. SCAs are not well seen. The basilar artery is widely patent. Posterior communicating arteries are diminutive or absent. PCAs are patent without evidence of significant proximal stenosis. There is diminished signal in both P2 segments due to artifact at the slab interface. The internal carotid arteries are widely patent from skullbase to carotid termini. The MCAs are patent with artifact through the left MCA bifurcation and bilateral proximal M2 segments, limiting assessment. There has been partial interval recanalization of the mid left M2 occlusion on the prior CTA, with Torres branch coursing superiorly in the sylvian fissure now being patent, although Torres inferiorly directed branch remains either occluded or with diminished flow. The ACAs are patent without evidence of significant proximal stenosis. No aneurysm is identified. IMPRESSION: 1.  Small acute left MCA infarcts. 2. Right-sided subarachnoid hemorrhage as seen on CT. Suspected small amount of acute cortical/subcortical infarction in the anterior right frontal operculum region, though much of the abnormal diffusion signal in the right hemisphere is attributed to hemorrhage. 3. Moderate chronic small vessel ischemic disease and cerebral atrophy. 4. No large vessel occlusion. Partial recanalization of previously described left M2 branch vessel occlusion. Electronically Signed   By: Sebastian AcheAllen  Grady M.D.   On: 01/17/2017 11:42   Mr Brain Wo Contrast  Result Date: 01/17/2017 CLINICAL DATA:  Stroke. Acute onset dysphagia and right-sided weakness. IV tPA administered with subsequent subarachnoid hemorrhage. EXAM: MRI HEAD WITHOUT CONTRAST MRA HEAD WITHOUT CONTRAST TECHNIQUE: Multiplanar, multiecho pulse sequences of the brain and surrounding structures were obtained without intravenous contrast. Angiographic images of the head were obtained using MRA technique without contrast. COMPARISON:  Head CT and CTA 01/17/2017 FINDINGS: MRI  HEAD FINDINGS Brain: There are small acute left MCA territory infarcts involving the caudate nucleus, posterior insula, and temporal lobe along the sylvian fissure. Abnormal diffusion signal along multiple sulci/ gyri in the right cerebral hemisphere, predominantly in the frontal operculum region, is attributed predominantly to subarachnoid hemorrhage, however Torres small amount of cortical and subcortical infarction is also suspected in the anterior operculum region. There may be Torres subcentimeter subacute infarct along the anterior right insula. Subarachnoid hemorrhage in multiple right frontal sulci is similar in distribution to the CT earlier today. There is also small volume subarachnoid hemorrhage in the right temporoparietal region and sylvian fissure. There may be minimal extra-axial blood along the posterior falx as described on CT. There is moderate cerebral atrophy. No  mass or midline shift is seen. Patchy T2 hyperintensities in the cerebral white matter are nonspecific but compatible with moderate chronic small vessel ischemic disease. Milder chronic small vessel changes are present in the pons. There is Torres small chronic left cerebellar infarct. Vascular: Major intracranial vascular flow voids are preserved. Skull and upper cervical spine: Unremarkable bone marrow signal. Sinuses/Orbits: Bilateral cataract extraction. Mild mucosal thickening in the paranasal sinuses. Small volume pharyngeal fluid in the setting of intubation. Clear mastoid air cells. Other: None. MRA HEAD FINDINGS The visualized distal vertebral arteries are patent with the left being dominant. Patent AICA origins are identified bilaterally. SCAs are not well seen. The basilar artery is widely patent. Posterior communicating arteries are diminutive or absent. PCAs are patent without evidence of significant proximal stenosis. There is diminished signal in both P2 segments due to artifact at the slab interface. The internal carotid arteries are widely patent from skullbase to carotid termini. The MCAs are patent with artifact through the left MCA bifurcation and bilateral proximal M2 segments, limiting assessment. There has been partial interval recanalization of the mid left M2 occlusion on the prior CTA, with Torres branch coursing superiorly in the sylvian fissure now being patent, although Torres inferiorly directed branch remains either occluded or with diminished flow. The ACAs are patent without evidence of significant proximal stenosis. No aneurysm is identified. IMPRESSION: 1. Small acute left MCA infarcts. 2. Right-sided subarachnoid hemorrhage as seen on CT. Suspected small amount of acute cortical/subcortical infarction in the anterior right frontal operculum region, though much of the abnormal diffusion signal in the right hemisphere is attributed to hemorrhage. 3. Moderate chronic small vessel ischemic disease  and cerebral atrophy. 4. No large vessel occlusion. Partial recanalization of previously described left M2 branch vessel occlusion. Electronically Signed   By: Sebastian Ache M.D.   On: 01/17/2017 11:42   Dg Chest Port 1 View  Result Date: 01/18/2017 CLINICAL DATA:  ETT/HX STROKE,CHF, atrial fibrillation not on anticoagulation , hypertension EXAM: PORTABLE CHEST 1 VIEW COMPARISON:  01/17/2017 FINDINGS: Endotracheal tube tip 3.5 cm above the carina. Nasogastric tube enters the stomach. Reverse lordotic projection is mildly rotated to the right. Atherosclerotic calcification of the aortic arch. Mild enlargement of the cardiopericardial silhouette without overt edema. Accentuated interstitium but the asymmetric accentuation in the left lung is improved. Thoracic spondylosis. Bony demineralization. Stable right rib deformities. IMPRESSION: 1. Improvement in the hazy interstitial accentuation in the left lung. Mild interstitial accentuation persists bilaterally. 2. Tubes and lines appear satisfactorily position. 3.  Aortic Atherosclerosis (ICD10-I70.0). 4. Mild cardiomegaly. Electronically Signed   By: Gaylyn Rong M.D.   On: 01/18/2017 07:49   Dg Chest Portable 1 View  Result Date: 01/17/2017 CLINICAL DATA:  Short of breath EXAM: PORTABLE  CHEST 1 VIEW COMPARISON:  None. FINDINGS: Endotracheal tube tip is about 4 cm superior to the carina. Esophageal tube tip is below the diaphragm. Mild cardiomegaly. Diffuse coarse interstitial opacity. No pleural effusion. Aortic atherosclerosis. No pneumothorax. Mild asymmetric hazy opacity in the left thorax IMPRESSION: 1. Endotracheal tube tip about 4 cm superior to carina 2. Mild cardiomegaly 3. Diffuse interstitial opacity possibly chronic but superimposed acute edema or interstitial inflammation is also considered. Diffuse hazy asymmetric opacity of left thorax likely related to rotation but asymmetric edema could also produce this appearance. Electronically Signed    By: Jasmine Pang M.D.   On: 01/17/2017 03:33   Dg Abd Portable 1 View  Result Date: 01/17/2017 CLINICAL DATA:  OG tube placement EXAM: PORTABLE ABDOMEN - 1 VIEW COMPARISON:  None. FINDINGS: Esophageal tube tip and side port projects over the mid stomach. Nonobstructed gas pattern. Contrast material within the renal collecting systems and bladder. IMPRESSION: Esophageal tube tip projects over the mid stomach Electronically Signed   By: Jasmine Pang M.D.   On: 01/17/2017 03:40    Microbiology: Recent Results (from the past 240 hour(s))  MRSA PCR Screening     Status: None   Collection Time: 01/17/17  6:00 AM  Result Value Ref Range Status   MRSA by PCR NEGATIVE NEGATIVE Final    Comment:        The GeneXpert MRSA Assay (FDA approved for NASAL specimens only), is one component of [facility] comprehensive MRSA colonization surveillance program. It is not intended to diagnose MRSA infection nor to guide or monitor treatment for MRSA infections.      Labs: CBC: Recent Labs  Lab 01/17/17 0347 01/17/17 0410 01/18/17 0337 01/20/17 0518  WBC 8.6  --  19.5* 8.6  NEUTROABS 6.3  --  15.6*  --   HGB 12.1 12.2 12.6 11.7*  HCT 39.1 36.0 36.4 37.4  MCV 91.8  --  87.1 90.8  PLT 221  --  238 209   Basic Metabolic Panel: Recent Labs  Lab 01/17/17 0347 01/17/17 0410 01/18/17 0337 01/19/17 0351 01/20/17 0518  NA 138 140 139 138 136  K 4.1 4.1 3.4* 3.7 3.6  CL 106 106 103 103 103  CO2 23  --  26 27 26   GLUCOSE 183* 182* 111* 108* 97  BUN 13 15 17 15 13   CREATININE 0.88 0.80 0.96 0.82 0.79  CALCIUM 8.9  --  9.4 9.3 9.5  MG 1.7  --  1.6* 1.8 1.8  PHOS 4.8*  --  3.2  --   --    Liver Function Tests: Recent Labs  Lab 01/17/17 0347  AST 30  ALT 16  ALKPHOS 91  BILITOT 0.7  PROT 6.2*  ALBUMIN 3.1*   No results for input(s): LIPASE, AMYLASE in the last 168 hours. No results for input(s): AMMONIA in the last 168 hours. Cardiac Enzymes: No results for input(s): CKTOTAL, CKMB,  CKMBINDEX, TROPONINI in the last 168 hours. BNP (last 3 results) Recent Labs    01/17/17 0347  BNP 140.1*   CBG: Recent Labs  Lab 01/19/17 1150 01/19/17 1709 01/20/17 0002 01/20/17 0631 01/20/17 1141  GLUCAP 112* 106* 99 96 92   Time spent: 35 minutes  Signed:  Waverly Tarquinio  [facility] 01/20/2017 , 8:46 AM

## 2017-01-25 DIAGNOSIS — I482 Chronic atrial fibrillation: Secondary | ICD-10-CM | POA: Diagnosis not present

## 2017-01-25 DIAGNOSIS — Z720 Tobacco use: Secondary | ICD-10-CM | POA: Diagnosis not present

## 2017-01-25 DIAGNOSIS — K625 Hemorrhage of anus and rectum: Secondary | ICD-10-CM | POA: Diagnosis not present

## 2017-01-25 DIAGNOSIS — I509 Heart failure, unspecified: Secondary | ICD-10-CM | POA: Diagnosis not present

## 2017-01-25 DIAGNOSIS — I10 Essential (primary) hypertension: Secondary | ICD-10-CM | POA: Diagnosis not present

## 2017-01-25 DIAGNOSIS — I609 Nontraumatic subarachnoid hemorrhage, unspecified: Secondary | ICD-10-CM | POA: Diagnosis not present

## 2017-01-25 DIAGNOSIS — I63512 Cerebral infarction due to unspecified occlusion or stenosis of left middle cerebral artery: Secondary | ICD-10-CM | POA: Diagnosis not present

## 2017-01-25 DIAGNOSIS — F0150 Vascular dementia without behavioral disturbance: Secondary | ICD-10-CM | POA: Diagnosis not present

## 2017-01-25 DIAGNOSIS — Z79899 Other long term (current) drug therapy: Secondary | ICD-10-CM | POA: Diagnosis not present

## 2017-01-29 ENCOUNTER — Other Ambulatory Visit: Payer: Self-pay

## 2017-01-29 NOTE — Patient Outreach (Signed)
[facility] Care Management  01/29/2017  Waynard EdwardsWilma Torres 04/22/1930 [id number]     EMMI-STROKE RED ON EMMI ALERT Day # 6 Date: 01/28/17 Red Alert Reason: "Questions/problems with meds? Yes"   Outreach attempt # 1 to patient. Spoke with dtr-Sandra. Patient has documented history of dementia. Dtr states she is answering automated phone calls for patient. Reviewed and addressed red alert with dtr. She voices that it was an error. She denies any issues or questions with meds. She states she has picked up all of patient's meds and understands when and how to take them. Patient went last week for MD f/u appt. Dtr voices that patient is doing well and doing all the things she was doing prior to hospitalization. She denies any RN CM needs or concerns at this time. Advised patient that they would continue to get automated EMMI-Stroke post discharge calls to assess how they are doing following recent hospitalization and will receive a call from a nurse if any of their responses were abnormal. Patient voiced understanding and was appreciative of f/u call.       Plan: RN CM will notify [facility] administrative assistant of case status and close case.    Antionette Fairyoshanda Asiah Browder, RN,BSN,CCM [facility] Care [facility] Telephonic Care Management Coordinator Direct Phone: [phone] Toll Free: [phone] Fax: [phone]

## 2017-01-31 DIAGNOSIS — I609 Nontraumatic subarachnoid hemorrhage, unspecified: Secondary | ICD-10-CM | POA: Diagnosis not present

## 2017-02-15 DIAGNOSIS — I679 Cerebrovascular disease, unspecified: Secondary | ICD-10-CM | POA: Diagnosis not present

## 2017-02-15 DIAGNOSIS — R300 Dysuria: Secondary | ICD-10-CM | POA: Diagnosis not present

## 2017-02-15 DIAGNOSIS — I609 Nontraumatic subarachnoid hemorrhage, unspecified: Secondary | ICD-10-CM | POA: Diagnosis not present

## 2017-02-15 DIAGNOSIS — I10 Essential (primary) hypertension: Secondary | ICD-10-CM | POA: Diagnosis not present

## 2017-02-15 DIAGNOSIS — I482 Chronic atrial fibrillation: Secondary | ICD-10-CM | POA: Diagnosis not present

## 2017-02-15 DIAGNOSIS — Z2821 Immunization not carried out because of patient refusal: Secondary | ICD-10-CM | POA: Diagnosis not present

## 2017-03-29 DIAGNOSIS — E785 Hyperlipidemia, unspecified: Secondary | ICD-10-CM | POA: Diagnosis not present

## 2017-03-29 DIAGNOSIS — I10 Essential (primary) hypertension: Secondary | ICD-10-CM | POA: Diagnosis not present

## 2017-03-29 DIAGNOSIS — I509 Heart failure, unspecified: Secondary | ICD-10-CM | POA: Diagnosis not present

## 2017-03-29 DIAGNOSIS — I803 Phlebitis and thrombophlebitis of lower extremities, unspecified: Secondary | ICD-10-CM | POA: Diagnosis not present

## 2017-03-29 DIAGNOSIS — Z0000 Encounter for general adult medical examination without abnormal findings: Secondary | ICD-10-CM | POA: Diagnosis not present

## 2017-03-29 DIAGNOSIS — I482 Chronic atrial fibrillation: Secondary | ICD-10-CM | POA: Diagnosis not present

## 2017-03-29 DIAGNOSIS — I609 Nontraumatic subarachnoid hemorrhage, unspecified: Secondary | ICD-10-CM | POA: Diagnosis not present

## 2017-04-05 DIAGNOSIS — I609 Nontraumatic subarachnoid hemorrhage, unspecified: Secondary | ICD-10-CM | POA: Diagnosis not present

## 2017-04-05 DIAGNOSIS — I6200 Nontraumatic subdural hemorrhage, unspecified: Secondary | ICD-10-CM | POA: Diagnosis not present

## 2017-04-30 ENCOUNTER — Other Ambulatory Visit: Payer: Self-pay

## 2017-05-02 ENCOUNTER — Other Ambulatory Visit: Payer: Self-pay

## 2017-05-24 DIAGNOSIS — Z6824 Body mass index (BMI) 24.0-24.9, adult: Secondary | ICD-10-CM | POA: Diagnosis not present

## 2017-05-24 DIAGNOSIS — I509 Heart failure, unspecified: Secondary | ICD-10-CM | POA: Diagnosis not present

## 2017-05-24 DIAGNOSIS — Z79899 Other long term (current) drug therapy: Secondary | ICD-10-CM | POA: Diagnosis not present

## 2017-05-24 DIAGNOSIS — I482 Chronic atrial fibrillation: Secondary | ICD-10-CM | POA: Diagnosis not present

## 2017-05-24 DIAGNOSIS — I609 Nontraumatic subarachnoid hemorrhage, unspecified: Secondary | ICD-10-CM | POA: Diagnosis not present

## 2017-05-24 DIAGNOSIS — R634 Abnormal weight loss: Secondary | ICD-10-CM | POA: Diagnosis not present

## 2017-05-24 DIAGNOSIS — I10 Essential (primary) hypertension: Secondary | ICD-10-CM | POA: Diagnosis not present

## 2017-07-05 DIAGNOSIS — Z6823 Body mass index (BMI) 23.0-23.9, adult: Secondary | ICD-10-CM | POA: Diagnosis not present

## 2017-07-05 DIAGNOSIS — R634 Abnormal weight loss: Secondary | ICD-10-CM | POA: Diagnosis not present

## 2017-09-10 DEATH — deceased

## 2018-03-17 IMAGING — DX DG CHEST 1V PORT
1 series · 1 of 1 positions shown · non-contrast
Comparison: 01/17/2017

CLINICAL DATA: ETT/HX STROKE,CHF, atrial fibrillation not on
anticoagulation , hypertension

EXAM:
PORTABLE CHEST 1 VIEW

[chest]
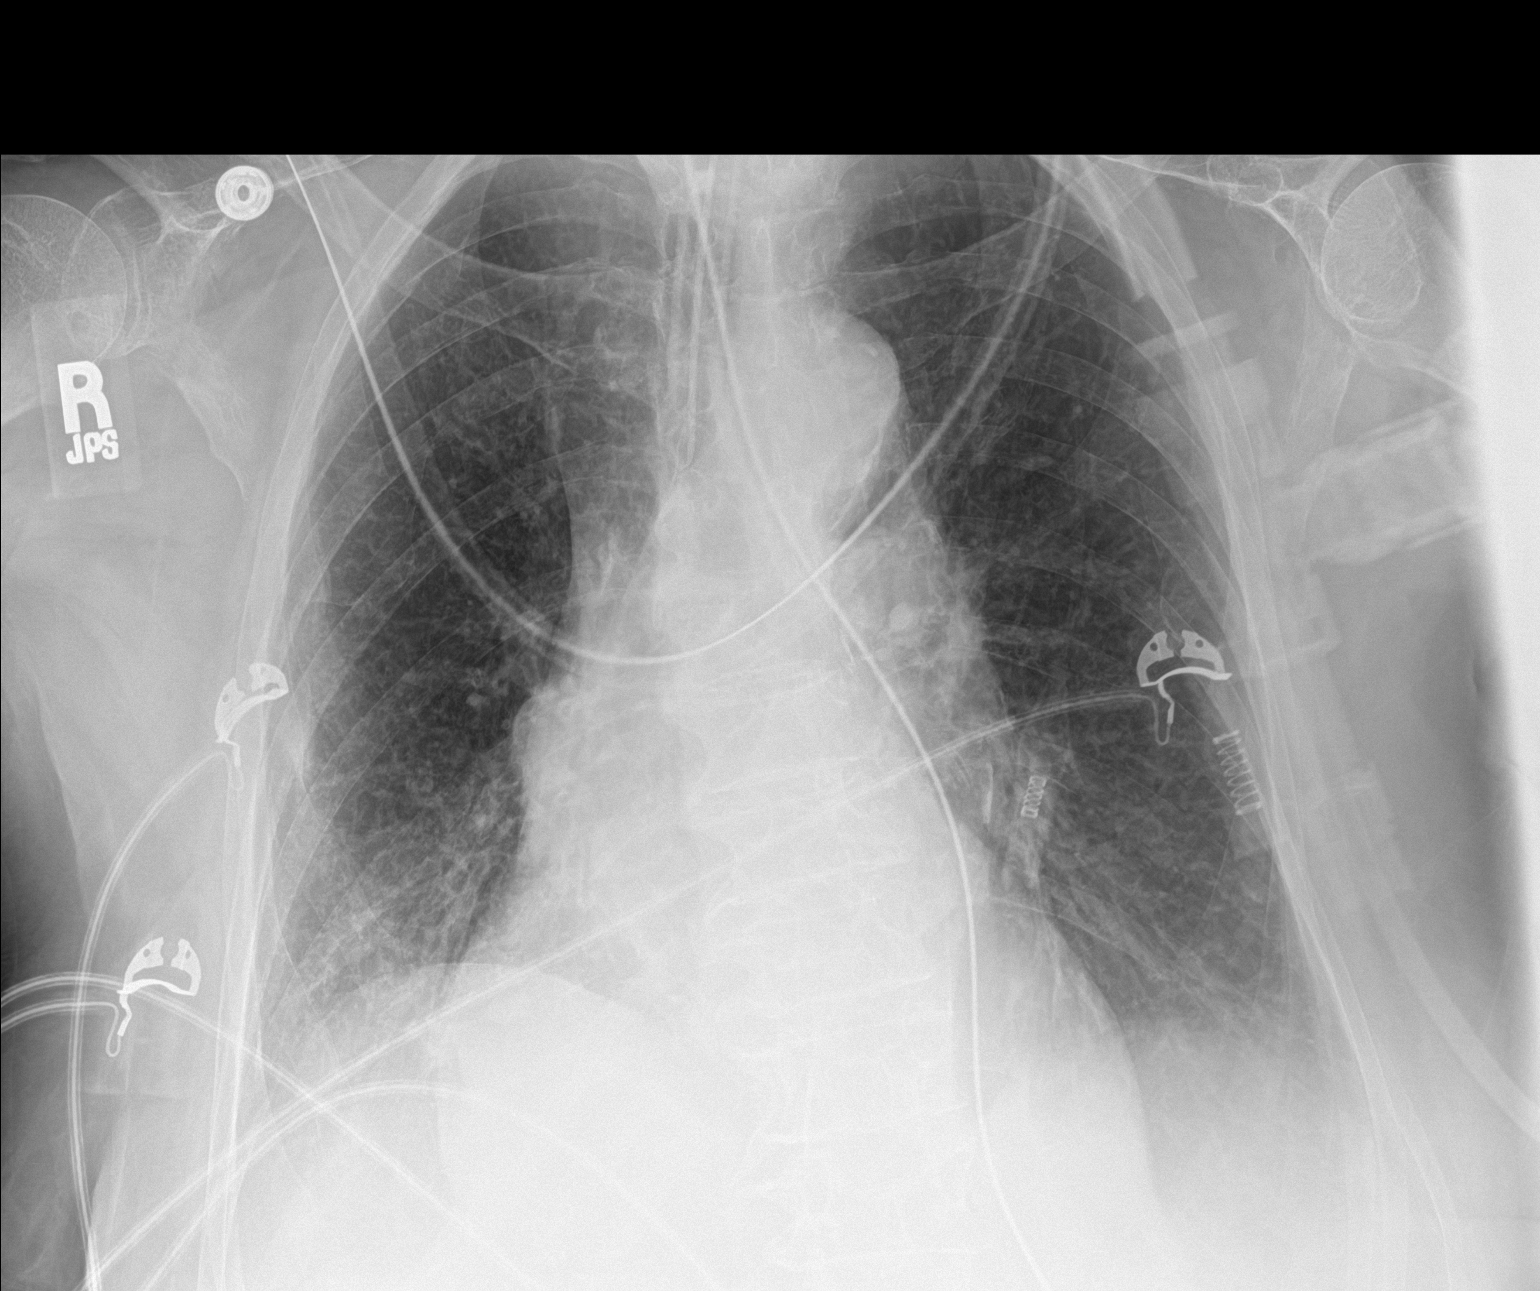

[1 of 1 positions shown; findings below may reference images not displayed]

FINDINGS: Endotracheal tube tip 3.5 cm above the carina. Nasogastric tube
enters the stomach.

Reverse lordotic projection is mildly rotated to the right.

Atherosclerotic calcification of the aortic arch. Mild enlargement
of the cardiopericardial silhouette without overt edema. Accentuated
interstitium but the asymmetric accentuation in the left lung is
improved.

Thoracic spondylosis. Bony demineralization. Stable right rib
deformities.
IMPRESSION: 1. Improvement in the hazy interstitial accentuation in the left
lung. Mild interstitial accentuation persists bilaterally.
2. Tubes and lines appear satisfactorily position.
3.  Aortic Atherosclerosis ([id number]).
4. Mild cardiomegaly.
